# Patient Record
Sex: Male | Born: 1950 | Race: White | Hispanic: No | Marital: Married | Smoking: Never smoker
Health system: Southern US, Community
[De-identification: ages and names within clinical notes are randomized; demographics above are authoritative.]

## PROBLEM LIST (undated history)

## (undated) DIAGNOSIS — M1990 Unspecified osteoarthritis, unspecified site: Secondary | ICD-10-CM

## (undated) DIAGNOSIS — F419 Anxiety disorder, unspecified: Secondary | ICD-10-CM

---

## 2003-01-03 ENCOUNTER — Encounter: Payer: Self-pay | Admitting: Family Medicine

## 2003-01-03 ENCOUNTER — Ambulatory Visit (HOSPITAL_COMMUNITY): Admission: RE | Admit: 2003-01-03 | Discharge: 2003-01-03 | Payer: Self-pay | Admitting: Family Medicine

## 2008-07-21 ENCOUNTER — Ambulatory Visit (HOSPITAL_COMMUNITY): Admission: RE | Admit: 2008-07-21 | Discharge: 2008-07-21 | Payer: Self-pay | Admitting: Family Medicine

## 2011-11-04 ENCOUNTER — Other Ambulatory Visit (HOSPITAL_COMMUNITY): Payer: Self-pay | Admitting: Family Medicine

## 2011-11-04 ENCOUNTER — Ambulatory Visit (HOSPITAL_COMMUNITY)
Admission: RE | Admit: 2011-11-04 | Discharge: 2011-11-04 | Disposition: A | Discharge status: Home / Self Care | Payer: BC Managed Care – PPO | Source: Ambulatory Visit | Attending: Family Medicine | Admitting: Family Medicine

## 2011-11-04 DIAGNOSIS — R52 Pain, unspecified: Secondary | ICD-10-CM

## 2011-11-04 DIAGNOSIS — M7989 Other specified soft tissue disorders: Secondary | ICD-10-CM | POA: Insufficient documentation

## 2011-11-04 DIAGNOSIS — M79609 Pain in unspecified limb: Secondary | ICD-10-CM | POA: Insufficient documentation

## 2011-12-12 HISTORY — PX: CARPOMETACARPAL (CMC) FUSION OF THUMB: SHX6290

## 2012-08-20 ENCOUNTER — Encounter (HOSPITAL_BASED_OUTPATIENT_CLINIC_OR_DEPARTMENT_OTHER): Payer: Self-pay | Admitting: *Deleted

## 2012-08-22 ENCOUNTER — Encounter (HOSPITAL_BASED_OUTPATIENT_CLINIC_OR_DEPARTMENT_OTHER): Payer: Self-pay | Admitting: Anesthesiology

## 2012-08-22 ENCOUNTER — Ambulatory Visit (HOSPITAL_BASED_OUTPATIENT_CLINIC_OR_DEPARTMENT_OTHER)
Admission: RE | Admit: 2012-08-22 | Discharge: 2012-08-22 | Disposition: A | Discharge status: Home / Self Care | Payer: BC Managed Care – PPO | Source: Ambulatory Visit | Attending: Orthopedic Surgery | Admitting: Orthopedic Surgery

## 2012-08-22 ENCOUNTER — Encounter (HOSPITAL_BASED_OUTPATIENT_CLINIC_OR_DEPARTMENT_OTHER)
Admission: RE | Disposition: A | Discharge status: Home / Self Care | Payer: Self-pay | Source: Ambulatory Visit | Attending: Orthopedic Surgery

## 2012-08-22 ENCOUNTER — Encounter (HOSPITAL_BASED_OUTPATIENT_CLINIC_OR_DEPARTMENT_OTHER): Payer: Self-pay | Admitting: *Deleted

## 2012-08-22 ENCOUNTER — Ambulatory Visit (HOSPITAL_BASED_OUTPATIENT_CLINIC_OR_DEPARTMENT_OTHER): Payer: BC Managed Care – PPO | Admitting: Anesthesiology

## 2012-08-22 DIAGNOSIS — M5412 Radiculopathy, cervical region: Secondary | ICD-10-CM | POA: Insufficient documentation

## 2012-08-22 DIAGNOSIS — M25549 Pain in joints of unspecified hand: Secondary | ICD-10-CM | POA: Insufficient documentation

## 2012-08-22 DIAGNOSIS — M19049 Primary osteoarthritis, unspecified hand: Secondary | ICD-10-CM

## 2012-08-22 HISTORY — DX: Unspecified osteoarthritis, unspecified site: M19.90

## 2012-08-22 HISTORY — PX: CARPOMETACARPEL SUSPENSION PLASTY: SHX5005

## 2012-08-22 SURGERY — CARPOMETACARPEL (CMC) SUSPENSION PLASTY
Anesthesia: General | Site: Hand | Laterality: Right | Wound class: Clean

## 2012-08-22 MED ORDER — FENTANYL CITRATE 0.05 MG/ML IJ SOLN
INTRAMUSCULAR | Status: DC | PRN
  Administered 2012-08-22 (×3): 25 ug via INTRAVENOUS

## 2012-08-22 MED ORDER — PROMETHAZINE HCL 25 MG/ML IJ SOLN
6.2500 mg | INTRAMUSCULAR | Status: DC | PRN
  Administered 2012-08-22: 6.25 mg via INTRAVENOUS

## 2012-08-22 MED ORDER — HYDROMORPHONE HCL PF 1 MG/ML IJ SOLN
0.2500 mg | INTRAMUSCULAR | Status: DC | PRN
  Administered 2012-08-22 (×4): 0.5 mg via INTRAVENOUS

## 2012-08-22 MED ORDER — CEFAZOLIN SODIUM 1-5 GM-% IV SOLN: 1.0000 g | Freq: Once | INTRAVENOUS | Status: DC

## 2012-08-22 MED ORDER — LACTATED RINGERS IV SOLN
INTRAVENOUS | Status: DC
  Administered 2012-08-22: 10:00:00 via INTRAVENOUS
  Administered 2012-08-22: 20 mL/h via INTRAVENOUS
  Administered 2012-08-22: 11:00:00 via INTRAVENOUS

## 2012-08-22 MED ORDER — DEXAMETHASONE SODIUM PHOSPHATE 4 MG/ML IJ SOLN
INTRAMUSCULAR | Status: DC | PRN
  Administered 2012-08-22: 8 mg via INTRAVENOUS

## 2012-08-22 MED ORDER — DEXAMETHASONE SODIUM PHOSPHATE 4 MG/ML IJ SOLN
INTRAMUSCULAR | Status: DC | PRN
  Administered 2012-08-22: 10 mg

## 2012-08-22 MED ORDER — MIDAZOLAM HCL 2 MG/2ML IJ SOLN
1.0000 mg | INTRAMUSCULAR | Status: DC | PRN
  Administered 2012-08-22: 2 mg via INTRAVENOUS

## 2012-08-22 MED ORDER — OXYCODONE-ACETAMINOPHEN 5-325 MG PO TABS: 1.0000 | ORAL_TABLET | ORAL | Status: AC | PRN

## 2012-08-22 MED ORDER — OXYCODONE HCL 5 MG/5ML PO SOLN: 5.0000 mg | Freq: Once | ORAL | Status: DC | PRN

## 2012-08-22 MED ORDER — CEFAZOLIN SODIUM 1-5 GM-% IV SOLN
1.0000 g | Freq: Once | INTRAVENOUS | Status: AC
  Administered 2012-08-22: 2 g via INTRAVENOUS

## 2012-08-22 MED ORDER — PROPOFOL 10 MG/ML IV BOLUS
INTRAVENOUS | Status: DC | PRN
  Administered 2012-08-22: 200 mg via INTRAVENOUS

## 2012-08-22 MED ORDER — MIDAZOLAM HCL 5 MG/5ML IJ SOLN
INTRAMUSCULAR | Status: DC | PRN
  Administered 2012-08-22: 1 mg via INTRAVENOUS

## 2012-08-22 MED ORDER — BUPIVACAINE-EPINEPHRINE PF 0.5-1:200000 % IJ SOLN
INTRAMUSCULAR | Status: DC | PRN
  Administered 2012-08-22: 150 mg

## 2012-08-22 MED ORDER — OXYCODONE HCL 5 MG PO TABS: 5.0000 mg | ORAL_TABLET | Freq: Once | ORAL | Status: DC | PRN

## 2012-08-22 MED ORDER — ONDANSETRON HCL 4 MG/2ML IJ SOLN
INTRAMUSCULAR | Status: DC | PRN
  Administered 2012-08-22: 4 mg via INTRAVENOUS

## 2012-08-22 MED ORDER — FENTANYL CITRATE 0.05 MG/ML IJ SOLN
50.0000 ug | INTRAMUSCULAR | Status: DC | PRN
  Administered 2012-08-22: 100 ug via INTRAVENOUS

## 2012-08-22 SURGICAL SUPPLY — 56 items
APL SKNCLS STERI-STRIP NONHPOA (GAUZE/BANDAGES/DRESSINGS) ×1
BAG DECANTER FOR FLEXI CONT (MISCELLANEOUS) IMPLANT
BANDAGE ELASTIC 4 VELCRO ST LF (GAUZE/BANDAGES/DRESSINGS) ×2 IMPLANT
BANDAGE GAUZE ELAST BULKY 4 IN (GAUZE/BANDAGES/DRESSINGS) ×2 IMPLANT
BENZOIN TINCTURE PRP APPL 2/3 (GAUZE/BANDAGES/DRESSINGS) ×2 IMPLANT
BLADE MINI RND TIP GREEN BEAV (BLADE) ×2 IMPLANT
BLADE SURG 15 STRL LF DISP TIS (BLADE) ×1 IMPLANT
BLADE SURG 15 STRL SS (BLADE) ×2
BNDG CMPR 9X4 STRL LF SNTH (GAUZE/BANDAGES/DRESSINGS) ×1
BNDG CMPR MD 5X2 ELC HKLP STRL (GAUZE/BANDAGES/DRESSINGS)
BNDG COHESIVE 4X5 TAN STRL (GAUZE/BANDAGES/DRESSINGS) ×2 IMPLANT
BNDG ELASTIC 2 VLCR STRL LF (GAUZE/BANDAGES/DRESSINGS) IMPLANT
BNDG ESMARK 4X9 LF (GAUZE/BANDAGES/DRESSINGS) ×2 IMPLANT
CLOTH BEACON ORANGE TIMEOUT ST (SAFETY) ×2 IMPLANT
CORDS BIPOLAR (ELECTRODE) ×2 IMPLANT
COVER TABLE BACK 60X90 (DRAPES) ×2 IMPLANT
CUFF TOURNIQUET SINGLE 18IN (TOURNIQUET CUFF) ×2 IMPLANT
DECANTER SPIKE VIAL GLASS SM (MISCELLANEOUS) IMPLANT
DRAPE EXTREMITY T 121X128X90 (DRAPE) ×2 IMPLANT
DRAPE OEC MINIVIEW 54X84 (DRAPES) ×2 IMPLANT
DRAPE SURG 17X23 STRL (DRAPES) ×2 IMPLANT
DURAPREP 26ML APPLICATOR (WOUND CARE) ×2 IMPLANT
GAUZE SPONGE 4X4 16PLY XRAY LF (GAUZE/BANDAGES/DRESSINGS) IMPLANT
GAUZE XEROFORM 1X8 LF (GAUZE/BANDAGES/DRESSINGS) ×2 IMPLANT
GLOVE BIO SURGEON STRL SZ 6.5 (GLOVE) ×2 IMPLANT
GLOVE BIO SURGEON STRL SZ8.5 (GLOVE) ×4 IMPLANT
GLOVE INDICATOR 7.0 STRL GRN (GLOVE) ×2 IMPLANT
GOWN PREVENTION PLUS XLARGE (GOWN DISPOSABLE) ×2 IMPLANT
NEEDLE HYPO 25X1 1.5 SAFETY (NEEDLE) IMPLANT
NS IRRIG 1000ML POUR BTL (IV SOLUTION) ×2 IMPLANT
PACK BASIN DAY SURGERY FS (CUSTOM PROCEDURE TRAY) ×2 IMPLANT
PAD CAST 3X4 CTTN HI CHSV (CAST SUPPLIES) ×1 IMPLANT
PADDING CAST ABS 3INX4YD NS (CAST SUPPLIES)
PADDING CAST ABS 4INX4YD NS (CAST SUPPLIES) ×1
PADDING CAST ABS COTTON 3X4 (CAST SUPPLIES) IMPLANT
PADDING CAST ABS COTTON 4X4 ST (CAST SUPPLIES) ×1 IMPLANT
PADDING CAST COTTON 3X4 STRL (CAST SUPPLIES) ×2
PADDING UNDERCAST 2  STERILE (CAST SUPPLIES) IMPLANT
PASSER SUT SWANSON 36MM LOOP (INSTRUMENTS) ×2 IMPLANT
SHEET MEDIUM DRAPE 40X70 STRL (DRAPES) ×2 IMPLANT
SPLINT PLASTER CAST XFAST 4X15 (CAST SUPPLIES) ×5 IMPLANT
SPLINT PLASTER XTRA FAST SET 4 (CAST SUPPLIES) ×5
SPONGE GAUZE 4X4 12PLY (GAUZE/BANDAGES/DRESSINGS) ×2 IMPLANT
STOCKINETTE 4X48 STRL (DRAPES) ×2 IMPLANT
STRIP CLOSURE SKIN 1/2X4 (GAUZE/BANDAGES/DRESSINGS) ×2 IMPLANT
SUT ETHILON 3 0 PS 1 (SUTURE) IMPLANT
SUT FIBERWIRE 2-0 18 17.9 3/8 (SUTURE)
SUT PROLENE 3 0 PS 2 (SUTURE) IMPLANT
SUT VICRYL 4-0 PS2 18IN ABS (SUTURE) IMPLANT
SUT VICRYL RAPIDE 4/0 PS 2 (SUTURE) IMPLANT
SUTURE FIBERWR 2-0 18 17.9 3/8 (SUTURE) IMPLANT
SYR BULB 3OZ (MISCELLANEOUS) ×2 IMPLANT
SYRINGE 10CC LL (SYRINGE) ×2 IMPLANT
TOWEL OR 17X24 6PK STRL BLUE (TOWEL DISPOSABLE) ×2 IMPLANT
UNDERPAD 30X30 INCONTINENT (UNDERPADS AND DIAPERS) ×2 IMPLANT
WATER STERILE IRR 1000ML POUR (IV SOLUTION) ×2 IMPLANT

## 2012-08-22 NOTE — H&P (Signed)
Micheal Moses is an 61 y.o. male.   Chief Complaint: right thumb pain HPI: as above s/p right thumb reconstruction this past spring with persistant pain and irritation of SRN  Past Medical History  Diagnosis Date  . Arthritis     rt hand    Past Surgical History  Procedure Date  . Carpometacarpal (cmc) fusion of thumb 12-2011    rt thumb    History reviewed. No pertinent family history. Social History:  reports that he has never smoked. He has never used smokeless tobacco. He reports that he drinks alcohol. He reports that he does not use illicit drugs.  Allergies: No Known Allergies  Medications Prior to Admission  Medication Sig Dispense Refill  . gabapentin (NEURONTIN) 300 MG capsule Take 300 mg by mouth 3 (three) times daily.      Marland Kitchen oxyCODONE-acetaminophen (PERCOCET) 10-325 MG per tablet Take 1 tablet by mouth every 4 (four) hours as needed.        No results found for this or any previous visit (from the past 48 hour(s)). No results found.  Review of Systems  All other systems reviewed and are negative.    Height 5\' 8"  (1.727 m), weight 85.276 kg (188 lb). Physical Exam  Constitutional: He is oriented to person, place, and time. He appears well-developed and well-nourished.  HENT:  Head: Normocephalic and atraumatic.  Cardiovascular: Normal rate.   Respiratory: Effort normal.  Musculoskeletal:       Right hand: He exhibits tenderness and bony tenderness.       Hands: Neurological: He is alert and oriented to person, place, and time.  Skin: Skin is warm.  Psychiatric: He has a normal mood and affect. His behavior is normal. Judgment and thought content normal.     Assessment/Plan As above  Plan redo right thumb suspensionplasty with SRN neurolysis vs resection  Vernesha Talbot A 08/22/2012, 9:34 AM

## 2012-08-22 NOTE — Transfer of Care (Signed)
Immediate Anesthesia Transfer of Care Note  Patient: Micheal Moses  Procedure(s) Performed: Procedure(s) (LRB) with comments: CARPOMETACARPEL (CMC) SUSPENSION PLASTY (Right) - REDO RIGHT THUMB CMC ARTHROPLASTY with APL Transfer/Osteophyte Removal/Right Wrist Superfacial Radial Nerve Neuroplasty   Patient Location: PACU  Anesthesia Type:GA combined with regional for post-op pain  Level of Consciousness: sedated and patient cooperative  Airway & Oxygen Therapy: Patient Spontanous Breathing and Patient connected to face mask oxygen  Post-op Assessment: Report given to PACU RN and Post -op Vital signs reviewed and stable  Post vital signs: Reviewed and stable  Complications: No apparent anesthesia complications

## 2012-08-22 NOTE — Anesthesia Postprocedure Evaluation (Signed)
Anesthesia Post Note  Patient: Micheal Moses  Procedure(s) Performed: Procedure(s) (LRB): CARPOMETACARPEL (CMC) SUSPENSION PLASTY (Right)  Anesthesia type: general  Patient location: PACU  Post pain: Pain level controlled  Post assessment: Patient's Cardiovascular Status Stable  Last Vitals:  Filed Vitals:   08/22/12 1300  BP: 138/90  Pulse: 76  Temp:   Resp: 15    Post vital signs: Reviewed and stable  Level of consciousness: sedated  Complications: No apparent anesthesia complications

## 2012-08-22 NOTE — Anesthesia Procedure Notes (Addendum)
Anesthesia Regional Block:  Supraclavicular block  Pre-Anesthetic Checklist: ,, timeout performed, Correct Patient, Correct Site, Correct Laterality, Correct Procedure,, site marked, risks and benefits discussed, Surgical consent,  Pre-op evaluation,  At surgeon's request and post-op pain management  Laterality: Right  Prep: chloraprep       Needles:  Injection technique: Single-shot  Needle Type: Echogenic Stimulator Needle     Needle Length: 5cm 5 cm Needle Gauge: 22 and 22 G    Additional Needles:  Procedures: ultrasound guided (picture in chart) and nerve stimulator Supraclavicular block  Nerve Stimulator or Paresthesia:  Response: bicep contraction, 0.45 mA,   Additional Responses:   Narrative:  Start time: 08/22/2012 9:52 AM End time: 08/22/2012 10:06 AM Injection made incrementally with aspirations every 5 mL.  Performed by: Personally  Anesthesiologist: J. Adonis Huguenin, MD  Additional Notes: Functioning IV was confirmed and monitors applied.  A 50mm 22ga echogenic arrow stimulator was used. Sterile prep and drape,hand hygiene and sterile gloves were used.Ultrasound guidance: relevant anatomy identified, needle position confirmed, local anesthetic spread visualized around nerve(s)., vascular puncture avoided.  Image printed for medical record.  Negative aspiration and negative test dose prior to incremental administration of local anesthetic. The patient tolerated the procedure well.  Interscalene brachial plexus block Procedure Name: LMA Insertion Date/Time: 08/22/2012 10:21 AM Performed by: Verlan Friends Pre-anesthesia Checklist: Patient identified, Emergency Drugs available, Suction available, Patient being monitored and Timeout performed Patient Re-evaluated:Patient Re-evaluated prior to inductionOxygen Delivery Method: Circle System Utilized Preoxygenation: Pre-oxygenation with 100% oxygen Intubation Type: IV induction Ventilation: Mask ventilation  without difficulty LMA: LMA inserted LMA Size: 5.0 Number of attempts: 1 Airway Equipment and Method: bite block (left lside) Placement Confirmation: positive ETCO2 Tube secured with: Tape Dental Injury: Teeth and Oropharynx as per pre-operative assessment  Comments: Patient has round-house crowns maxillary and mandibular.  They all appear stable.  Dentition as pre op after insertion of LMA

## 2012-08-22 NOTE — Progress Notes (Signed)
Assisted Dr. Singer with right, ultrasound guided, supraclavicular block. Side rails up, monitors on throughout procedure. See vital signs in flow sheet. Tolerated Procedure well. 

## 2012-08-22 NOTE — Brief Op Note (Signed)
08/22/2012  12:11 PM  PATIENT:  Mosetta Pigeon  61 y.o. 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  POST-OPERATIVE DIAGNOSIS:   RIGHT THUMB AND WRIST ARHTRITIS  PROCEDURE:  Procedure(s) (LRB) with comments: CARPOMETACARPEL (CMC) SUSPENSION PLASTY (Right) - REDO RIGHT THUMB CMC ARTHROPLASTY with APL Transfer/Osteophyte Removal/Right Wrist Superfacial Radial Nerve Neuroplasty   SURGEON:  Surgeon(s) and Role:    * Marlowe Shores, MD - Primary  PHYSICIAN ASSISTANT:   ASSISTANTS: none   ANESTHESIA:   general  EBL:  Total I/O In: 1400 [I.V.:1400] Out: -   BLOOD ADMINISTERED:none  DRAINS: none   LOCAL MEDICATIONS USED:  NONE  SPECIMEN:  No Specimen  DISPOSITION OF SPECIMEN:  N/A  COUNTS:  YES  TOURNIQUET:   Total Tourniquet Time Documented: Upper Arm (Right) - 94 minutes  DICTATION: .Other Dictation: Dictation Number [id number]  PLAN OF CARE: Discharge to home after PACU  PATIENT DISPOSITION:  PACU - hemodynamically stable.   Delay start of Pharmacological VTE agent (>24hrs) due to surgical blood loss or risk of bleeding: not applicable

## 2012-08-22 NOTE — Anesthesia Preprocedure Evaluation (Signed)
Anesthesia Evaluation    Airway       Dental   Pulmonary neg pulmonary ROS,          Cardiovascular negative cardio ROS      Neuro/Psych negative neurological ROS  negative psych ROS   GI/Hepatic negative GI ROS, Neg liver ROS,   Endo/Other  negative endocrine ROS  Renal/GU negative Renal ROS     Musculoskeletal   Abdominal   Peds  Hematology   Anesthesia Other Findings   Reproductive/Obstetrics                           Anesthesia Physical Anesthesia Plan  ASA: II  Anesthesia Plan: General   Post-op Pain Management:    Induction: Intravenous  Airway Management Planned: LMA  Additional Equipment:   Intra-op Plan:   Post-operative Plan: Extubation in OR  Informed Consent:   Plan Discussed with:   Anesthesia Plan Comments:         Anesthesia Quick Evaluation

## 2012-08-22 NOTE — Op Note (Signed)
See note [id number]

## 2012-08-23 ENCOUNTER — Encounter (HOSPITAL_BASED_OUTPATIENT_CLINIC_OR_DEPARTMENT_OTHER): Payer: Self-pay | Admitting: Orthopedic Surgery

## 2012-08-23 LAB — POCT HEMOGLOBIN-HEMACUE: Hemoglobin: 16.9 g/dL (ref 13.0–17.0)

## 2012-08-24 NOTE — Op Note (Signed)
NAME:  Micheal Moses, Micheal Moses                  ACCOUNT NO.:  [id number]  MEDICAL RECORD NO.:  [id number]  LOCATION:                                 FACILITY:  PHYSICIAN:  Artist Pais. Gerrica Cygan, M.D.DATE OF BIRTH:  [birth year]  DATE OF PROCEDURE:  08/22/2012 DATE OF DISCHARGE:                              OPERATIVE REPORT   PREOPERATIVE DIAGNOSES: 1. Right thumb carpometacarpal pain status post flexor carpi radialis     interpositional arthroplasty. 2. Superficial radial nerve neuritis.  POSTOPERATIVE DIAGNOSES: 1. Right thumb carpometacarpal pain status post flexor carpi radialis     interpositional arthroplasty. 2. Superficial radial nerve neuritis.  PROCEDURES: 1. Revision carpometacarpal arthroplasty with abductor pollicis longus     tendon transfer. 2. Neurolysis of superficial radial nerve.  SURGEON:  Artist Pais. Mina Marble, MD  ASSISTANT:  None.  ANESTHESIA:  Axillary block and general.  COMPLICATIONS:  None.  DRAINS:  None.  DESCRIPTION OF PROCEDURE:  The patient was taken to the operating suite. After induction of adequate general anesthesia and axillary block analgesia, the right upper extremity was prepped and draped in sterile fashion.  An Esmarch was used to exsanguinate the limb.  Tourniquet was inflated to 250 mmHg.  At this point in time, incision was made, incorporating the old incision over the [facility] joint, extended proximally up to the area of the radial styloid.  Skin was incised sharply. Dissection was carried down to the carpometacarpal joint.  The carpometacarpal joint was identified fluoroscopically.  The snuffbox artery was carefully identified and retracted.  An arthrotomy was performed.  The previously used FCR interpositional tendon was removed, revealing degenerative changes at the thumb metacarpal base, and the old drill hole which had eroded into a U-shaped defect, the interval between the thumb metacarpal base and index metacarpal base was  carefully debrided of a large osteophyte which was removed in its entirety.  At this point in time, a bone hole was used to create a new transosseous canal in the thumb metacarpal base, exiting in the joint line medially and distally and dorsally 2 cm from the U-shaped defect.  This was done with hand awl and sequential hand drills.  After this was established, the first dorsal compartment was identified and released, lysing all adhesions.  The superficial radial nerve branches were also identified and under loupe magnification, enterolysis was performed.  There was no obvious neuroma formation or injury to the nerve, but some scarring around the original incision.  After this was completed, the radial most slip of the abductor pollicis longus tendon group was transected at the musculotendinous junction, drawn into the distal aspect of the wound. At this point in time, a second incision was made over the index metacarpal base dorsally.  After this was completed, dissection was carried down to the metaphyseal and diaphyseal flare, and a transosseous canal was made in the index metacarpal base, exiting in the original wound.  The wound was then thoroughly irrigated.  All bony debris was removed.  At this point in time, the APL tendon slip was passed through the thumb metacarpal base, and then transferred up dorsally to the index metacarpal and tied on the appropriate  tension to the extensor carpi radialis brevis tendon using 2-0 FiberWire suture in a Pulvertaft weave. The wounds were thoroughly irrigated.  X-ray showed good suspension on AP, lateral, and oblique view, and incisions were closed with 3-0 Prolene subcuticular stitch.  Steri-Strips, 4x4s, fluffs, and a compressive dressing, radial gutter splint was applied.  The patient tolerated the procedure well and went to the recovery room in stable fashion.     Artist Pais Mina Marble, M.D.     MAW/MEDQ  D:  08/22/2012  T:   08/23/2012  Job:  [id number]

## 2012-08-31 ENCOUNTER — Other Ambulatory Visit (HOSPITAL_COMMUNITY)
Admission: RE | Admit: 2012-08-31 | Discharge: 2012-08-31 | Disposition: A | Discharge status: Home / Self Care | Payer: BC Managed Care – PPO | Source: Ambulatory Visit | Attending: Family Medicine | Admitting: Family Medicine

## 2012-08-31 DIAGNOSIS — L989 Disorder of the skin and subcutaneous tissue, unspecified: Secondary | ICD-10-CM | POA: Insufficient documentation

## 2012-12-05 ENCOUNTER — Other Ambulatory Visit: Payer: Self-pay | Admitting: Physician Assistant

## 2014-01-17 ENCOUNTER — Other Ambulatory Visit: Payer: Self-pay | Admitting: Physician Assistant

## 2015-12-01 DIAGNOSIS — M25561 Pain in right knee: Secondary | ICD-10-CM | POA: Diagnosis not present

## 2016-01-19 DIAGNOSIS — M79641 Pain in right hand: Secondary | ICD-10-CM | POA: Diagnosis not present

## 2016-01-19 DIAGNOSIS — Z79891 Long term (current) use of opiate analgesic: Secondary | ICD-10-CM | POA: Diagnosis not present

## 2016-01-19 DIAGNOSIS — M25561 Pain in right knee: Secondary | ICD-10-CM | POA: Diagnosis not present

## 2016-01-22 DIAGNOSIS — E663 Overweight: Secondary | ICD-10-CM | POA: Diagnosis not present

## 2016-01-22 DIAGNOSIS — C44509 Unspecified malignant neoplasm of skin of other part of trunk: Secondary | ICD-10-CM | POA: Diagnosis not present

## 2016-01-22 DIAGNOSIS — Z87442 Personal history of urinary calculi: Secondary | ICD-10-CM | POA: Diagnosis not present

## 2016-01-22 DIAGNOSIS — Z79891 Long term (current) use of opiate analgesic: Secondary | ICD-10-CM | POA: Diagnosis not present

## 2016-05-30 DIAGNOSIS — Z79891 Long term (current) use of opiate analgesic: Secondary | ICD-10-CM | POA: Diagnosis not present

## 2016-05-30 DIAGNOSIS — Z23 Encounter for immunization: Secondary | ICD-10-CM | POA: Diagnosis not present

## 2016-05-30 DIAGNOSIS — M25561 Pain in right knee: Secondary | ICD-10-CM | POA: Diagnosis not present

## 2016-05-30 DIAGNOSIS — M79641 Pain in right hand: Secondary | ICD-10-CM | POA: Diagnosis not present

## 2016-07-05 DIAGNOSIS — Z79891 Long term (current) use of opiate analgesic: Secondary | ICD-10-CM | POA: Diagnosis not present

## 2016-07-05 DIAGNOSIS — F419 Anxiety disorder, unspecified: Secondary | ICD-10-CM | POA: Diagnosis not present

## 2016-07-05 DIAGNOSIS — F4321 Adjustment disorder with depressed mood: Secondary | ICD-10-CM | POA: Diagnosis not present

## 2016-07-05 DIAGNOSIS — M79641 Pain in right hand: Secondary | ICD-10-CM | POA: Diagnosis not present

## 2016-07-26 DIAGNOSIS — L570 Actinic keratosis: Secondary | ICD-10-CM | POA: Diagnosis not present

## 2016-07-26 DIAGNOSIS — L298 Other pruritus: Secondary | ICD-10-CM | POA: Diagnosis not present

## 2016-08-09 DIAGNOSIS — M79641 Pain in right hand: Secondary | ICD-10-CM | POA: Diagnosis not present

## 2016-08-09 DIAGNOSIS — M25561 Pain in right knee: Secondary | ICD-10-CM | POA: Diagnosis not present

## 2016-08-09 DIAGNOSIS — F4321 Adjustment disorder with depressed mood: Secondary | ICD-10-CM | POA: Diagnosis not present

## 2016-08-09 DIAGNOSIS — Z79891 Long term (current) use of opiate analgesic: Secondary | ICD-10-CM | POA: Diagnosis not present

## 2016-11-07 DIAGNOSIS — G4700 Insomnia, unspecified: Secondary | ICD-10-CM | POA: Diagnosis not present

## 2016-11-07 DIAGNOSIS — N529 Male erectile dysfunction, unspecified: Secondary | ICD-10-CM | POA: Diagnosis not present

## 2016-11-07 DIAGNOSIS — M79641 Pain in right hand: Secondary | ICD-10-CM | POA: Diagnosis not present

## 2016-11-07 DIAGNOSIS — Z79891 Long term (current) use of opiate analgesic: Secondary | ICD-10-CM | POA: Diagnosis not present

## 2016-12-15 ENCOUNTER — Other Ambulatory Visit: Payer: Self-pay | Admitting: Physician Assistant

## 2016-12-15 DIAGNOSIS — L570 Actinic keratosis: Secondary | ICD-10-CM | POA: Diagnosis not present

## 2016-12-15 DIAGNOSIS — D492 Neoplasm of unspecified behavior of bone, soft tissue, and skin: Secondary | ICD-10-CM | POA: Diagnosis not present

## 2017-02-08 DIAGNOSIS — M79641 Pain in right hand: Secondary | ICD-10-CM | POA: Diagnosis not present

## 2017-02-08 DIAGNOSIS — G4700 Insomnia, unspecified: Secondary | ICD-10-CM | POA: Diagnosis not present

## 2017-02-08 DIAGNOSIS — Z79891 Long term (current) use of opiate analgesic: Secondary | ICD-10-CM | POA: Diagnosis not present

## 2017-02-08 DIAGNOSIS — M79671 Pain in right foot: Secondary | ICD-10-CM | POA: Diagnosis not present

## 2017-05-09 DIAGNOSIS — M25561 Pain in right knee: Secondary | ICD-10-CM | POA: Diagnosis not present

## 2017-05-09 DIAGNOSIS — G4700 Insomnia, unspecified: Secondary | ICD-10-CM | POA: Diagnosis not present

## 2017-05-09 DIAGNOSIS — M79671 Pain in right foot: Secondary | ICD-10-CM | POA: Diagnosis not present

## 2017-05-09 DIAGNOSIS — Z79891 Long term (current) use of opiate analgesic: Secondary | ICD-10-CM | POA: Diagnosis not present

## 2017-05-17 ENCOUNTER — Ambulatory Visit (HOSPITAL_COMMUNITY)
Admission: RE | Admit: 2017-05-17 | Discharge: 2017-05-17 | Disposition: A | Discharge status: Home / Self Care | Payer: PPO | Source: Ambulatory Visit | Attending: Family Medicine | Admitting: Family Medicine

## 2017-05-17 ENCOUNTER — Other Ambulatory Visit (HOSPITAL_COMMUNITY): Payer: Self-pay | Admitting: Family Medicine

## 2017-05-17 DIAGNOSIS — R52 Pain, unspecified: Secondary | ICD-10-CM

## 2017-05-17 DIAGNOSIS — M79671 Pain in right foot: Secondary | ICD-10-CM | POA: Insufficient documentation

## 2017-08-09 DIAGNOSIS — M25561 Pain in right knee: Secondary | ICD-10-CM | POA: Diagnosis not present

## 2017-08-09 DIAGNOSIS — Z23 Encounter for immunization: Secondary | ICD-10-CM | POA: Diagnosis not present

## 2017-08-09 DIAGNOSIS — Z79891 Long term (current) use of opiate analgesic: Secondary | ICD-10-CM | POA: Diagnosis not present

## 2017-08-09 DIAGNOSIS — M79641 Pain in right hand: Secondary | ICD-10-CM | POA: Diagnosis not present

## 2017-08-09 DIAGNOSIS — G4700 Insomnia, unspecified: Secondary | ICD-10-CM | POA: Diagnosis not present

## 2017-08-18 DIAGNOSIS — M25561 Pain in right knee: Secondary | ICD-10-CM | POA: Diagnosis not present

## 2017-08-18 DIAGNOSIS — M79641 Pain in right hand: Secondary | ICD-10-CM | POA: Diagnosis not present

## 2017-08-18 DIAGNOSIS — E663 Overweight: Secondary | ICD-10-CM | POA: Diagnosis not present

## 2017-08-18 DIAGNOSIS — G4700 Insomnia, unspecified: Secondary | ICD-10-CM | POA: Diagnosis not present

## 2017-09-14 ENCOUNTER — Telehealth: Payer: Self-pay

## 2017-09-14 NOTE — Telephone Encounter (Signed)
Pt is aware we do not know how much it would cost for a polyp to be removed. He said he was new at this and he was just asking questions.  See separate triage.

## 2017-09-14 NOTE — Telephone Encounter (Signed)
Pt called asking to speak with DS. I told him that I would have to take a message and she could call him back. He wants to know if a polyp is removed during his colonoscopy how much would it cost to cut it out. Please call him at [phone] He said to call him later this afternoon.

## 2017-09-14 NOTE — Telephone Encounter (Signed)
Gastroenterology Pre-Procedure Review  Request Date: 09/14/2017 Requesting Physician:Dr. Karie Kirks   PATIENT REVIEW QUESTIONS: The patient responded to the following health history questions as indicated:    1. Diabetes Melitis: no 2. Joint replacements in the past 12 months: no 3. Major health problems in the past 3 months: NO 4. Has an artificial valve or MVP: no 5. Has a defibrillator: no 6. Has been advised in past to take antibiotics in advance of a procedure like teeth cleaning: no 7. Family history of colon cancer: no  8. Alcohol Use: YES    One 6 pack of beer on week-ends  ( in the summer he drinks one beer a day)  9. History of sleep apnea: no  10. History of coronary artery or other vascular stents placed within the last 12 months: no 11. History of any prior anesthesia complications: no    MEDICATIONS & ALLERGIES:    Patient reports the following regarding taking any blood thinners:   Plavix? no Aspirin? no Coumadin? no Brilinta? no Xarelto? no Eliquis? no Pradaxa? no Savaysa? no Effient? no  Patient confirms/reports the following medications:  Current Outpatient Medications  Medication Sig Dispense Refill  . oxyCODONE-acetaminophen (ROXICET) 5-325 MG per tablet Take 1 tablet by mouth every 4 (four) hours as needed for pain. (Patient taking differently: Take 1 tablet by mouth every 4 (four) hours as needed for pain. PT said he has a bad knee and bad foot. Sometimes he takes 3 tablets a day and sometimes not any. He has been on this for about 6 months.) 30 tablet 0   No current facility-administered medications for this visit.     Patient confirms/reports the following allergies:  No Known Allergies  No orders of the defined types were placed in this encounter.   AUTHORIZATION INFORMATION Primary Insurance:   ID #:  Group #:  Pre-Cert / Auth required:  Pre-Cert / Auth #:   Secondary Insurance:   ID #:   Group #:  Pre-Cert / Auth required:  Pre-Cert / Auth  #:   SCHEDULE INFORMATION: Procedure has been scheduled as follows:  Date:       Time:  Location:   This Gastroenterology Pre-Precedure Review Form is being routed to the following provider(s): Barney Drain, MD

## 2017-09-16 NOTE — Telephone Encounter (Signed)
Needs ov due to regular etoh use and oxycodone. Will need propofol.

## 2017-09-18 NOTE — Telephone Encounter (Signed)
Pt is scheduled for an Ov with Walden Field, NP on 11/08/2017 at 3:30 pm.

## 2017-09-18 NOTE — Telephone Encounter (Signed)
LMOM to call.

## 2017-11-01 DIAGNOSIS — C44509 Unspecified malignant neoplasm of skin of other part of trunk: Secondary | ICD-10-CM | POA: Diagnosis not present

## 2017-11-01 DIAGNOSIS — M79641 Pain in right hand: Secondary | ICD-10-CM | POA: Diagnosis not present

## 2017-11-01 DIAGNOSIS — Z79891 Long term (current) use of opiate analgesic: Secondary | ICD-10-CM | POA: Diagnosis not present

## 2017-11-01 DIAGNOSIS — M25561 Pain in right knee: Secondary | ICD-10-CM | POA: Diagnosis not present

## 2017-11-08 ENCOUNTER — Encounter: Payer: Self-pay | Admitting: Nurse Practitioner

## 2017-11-08 ENCOUNTER — Other Ambulatory Visit: Payer: Self-pay

## 2017-11-08 ENCOUNTER — Ambulatory Visit (INDEPENDENT_AMBULATORY_CARE_PROVIDER_SITE_OTHER): Payer: PPO | Admitting: Nurse Practitioner

## 2017-11-08 DIAGNOSIS — R69 Illness, unspecified: Secondary | ICD-10-CM | POA: Diagnosis not present

## 2017-11-08 DIAGNOSIS — Z1211 Encounter for screening for malignant neoplasm of colon: Secondary | ICD-10-CM

## 2017-11-08 MED ORDER — PEG 3350-KCL-NA BICARB-NACL 420 G PO SOLR: 4000.0000 mL | ORAL | 0 refills | Status: DC

## 2017-11-08 NOTE — Progress Notes (Signed)
Primary Care Physician:  Lemmie Evens, MD Primary Gastroenterologist:  Dr. Oneida Alar  Chief Complaint  Patient presents with  . Colonoscopy    never had tcs    HPI:   Micheal Moses is a 67 y.o. male who presents on referral for scheduling of colonoscopy.  Phone triage was deferred to office visit due to alcohol consumption including a 6 pack of beer on the weekends and during the summer daily beer.  He is also on narcotic pain medications.  No history of colonoscopy in our system.  Today he states he's doing ok overall. States he's had an itch on his right buttock. Scratched it raw, applied neosporin and tried [place]. Denies abdominal pain, N/V, hematochezia, melena, fever, chills, unintentional weight loss, acute changes in bowel movements. Denies chest pain, dyspnea, dizziness, lightheadedness, syncope, near syncope. Denies any other upper or lower GI symptoms.  Past Medical History:  Diagnosis Date  . Arthritis    rt hand    Past Surgical History:  Procedure Laterality Date  . CARPOMETACARPAL (CMC) FUSION OF THUMB  12-2011   rt thumb  . CARPOMETACARPEL SUSPENSION PLASTY  08/22/2012   Procedure: CARPOMETACARPEL ([place]) SUSPENSION PLASTY;  Surgeon: Schuyler Amor, MD;  Location: [facility];  Service: Orthopedics;  Laterality: Right;  REDO RIGHT THUMB CMC ARTHROPLASTY with APL Transfer/Osteophyte Removal/Right Wrist Superfacial Radial Nerve Neuroplasty     Current Outpatient Medications  Medication Sig Dispense Refill  . ALPRAZolam (XANAX) 0.5 MG tablet Take 1 tablet by mouth daily.    Marland Kitchen oxyCODONE-acetaminophen (ROXICET) 5-325 MG per tablet Take 1 tablet by mouth every 4 (four) hours as needed for pain. (Patient taking differently: Take 1 tablet by mouth every 4 (four) hours as needed for pain. PT said he has a bad knee and bad foot. Sometimes he takes 3 tablets a day and sometimes not any. He has been on this for about 6 months.) 30 tablet 0   No current  facility-administered medications for this visit.     Allergies as of 11/08/2017 - Review Complete 11/08/2017  Allergen Reaction Noted  . Other Nausea And Vomiting 11/08/2017    Family History  Problem Relation Age of Onset  . Colon cancer Neg Hx     Social History   Socioeconomic History  . Marital status: Married    Spouse name: Not on file  . Number of children: Not on file  . Years of education: Not on file  . Highest education level: Not on file  Social Needs  . Financial resource strain: Not on file  . Food insecurity - worry: Not on file  . Food insecurity - inability: Not on file  . Transportation needs - medical: Not on file  . Transportation needs - non-medical: Not on file  Occupational History  . Not on file  Tobacco Use  . Smoking status: Never Smoker  . Smokeless tobacco: Never Used  Substance and Sexual Activity  . Alcohol use: Yes    Comment: beer a day  . Drug use: Yes    Types: Marijuana    Comment: occasionally; once every 2-3 months.  . Sexual activity: Not on file  Other Topics Concern  . Not on file  Social History Narrative  . Not on file    Review of Systems: Complete ROS negative except as per HPI.    Physical Exam: BP 130/74   Pulse 65   Temp (!) 96.6 F (35.9 C) (Oral)   Ht  5\' 8"  (1.727 m)   Wt 182 lb 6.4 oz (82.7 kg)   BMI 27.73 kg/m  General:   Alert and oriented. Pleasant and cooperative. Well-nourished and well-developed.  Eyes:  Without icterus, sclera clear and conjunctiva pink.  Ears:  Normal auditory acuity. Cardiovascular:  S1, S2 present without murmurs appreciated. Extremities without clubbing or edema. Respiratory:  Clear to auscultation bilaterally. No wheezes, rales, or rhonchi. No distress.  Gastrointestinal:  +BS, soft, non-tender and non-distended. No HSM noted. No guarding or rebound. No masses appreciated.  Rectal:  Deferred  Musculoskalatal:  Symmetrical without gross deformities. Neurologic:  Alert and  oriented x4;  grossly normal neurologically. Psych:  Alert and cooperative. Normal mood and affect. Heme/Lymph/Immune: No excessive bruising noted.    11/08/2017 4:07 PM   Disclaimer: This note was dictated with voice recognition software. Similar sounding words can inadvertently be transcribed and may not be corrected upon review.

## 2017-11-08 NOTE — Assessment & Plan Note (Addendum)
Patient is due for routine colonoscopy.  This will be his first ever colonoscopy.  He was brought into the office due to alcohol and medications.  He will need augmented sedation.  No other overt increased risk factors identified.  We will proceed with colonoscopy at this time.  The patient notes for his hand surgery under general anesthesia, it took a prolonged time for him to wake up.  Proceed with colonoscopy on propofol/MAC with Dr. Oneida Alar in the near future. The risks, benefits, and alternatives have been discussed in detail with the patient. They state understanding and desire to proceed.   Patient is currently on Xanax and oxycodone.  He drinks about 1 beer a day.  Occasional/rare marijuana use.  No other drugs.  No other anticoagulants, anxiolytics, chronic pain medications, or antidepressants.  We will plan for the procedure on propofol/MAC to promote adequate sedation.

## 2017-11-08 NOTE — Patient Instructions (Signed)
1. We will schedule your procedure for you. 2. Further recommendations will be made after your colonoscopy. 3. Return for follow-up based on the recommendations made after your colonoscopy. 4. Call us if you have any questions or concerns.

## 2017-11-09 ENCOUNTER — Telehealth: Payer: Self-pay

## 2017-11-09 NOTE — Progress Notes (Signed)
cc'ed to pcp °

## 2017-11-09 NOTE — Telephone Encounter (Signed)
Tried to call pt to inform of pre-op appt 01/02/18 at 10:00am, no answer, LMOAM. Letter mailed.

## 2017-11-15 DIAGNOSIS — L570 Actinic keratosis: Secondary | ICD-10-CM | POA: Diagnosis not present

## 2017-11-15 DIAGNOSIS — L821 Other seborrheic keratosis: Secondary | ICD-10-CM | POA: Diagnosis not present

## 2017-11-15 DIAGNOSIS — D229 Melanocytic nevi, unspecified: Secondary | ICD-10-CM | POA: Diagnosis not present

## 2017-11-24 DIAGNOSIS — J029 Acute pharyngitis, unspecified: Secondary | ICD-10-CM | POA: Diagnosis not present

## 2017-12-28 NOTE — Patient Instructions (Signed)
Micheal Moses  12/28/2017     @PREFPERIOPPHARMACY @   Your procedure is scheduled on 01/09/2018.  Report to [name] at 7:30 A.M.  Call this number if you have problems the morning of surgery:  [phone]   Remember:  Do not eat food or drink liquids after midnight.  Take these medicines the morning of surgery with A SIP OF WATER xanax, oxycodone if needed   Do not wear jewelry, make-up or nail polish.  Do not wear lotions, powders, or perfumes, or deodorant.  Do not shave 48 hours prior to surgery.  Men may shave face and neck.  Do not bring valuables to the hospital.  [facility] is not responsible for any belongings or valuables.  Contacts, dentures or bridgework may not be worn into surgery.  Leave your suitcase in the car.  After surgery it may be brought to your room.  For patients admitted to the hospital, discharge time will be determined by your treatment team.  Patients discharged the day of surgery will not be allowed to drive home.    Please read over the following fact sheets that you were given. Anesthesia Post-op Instructions       PATIENT INSTRUCTIONS POST-ANESTHESIA  IMMEDIATELY FOLLOWING SURGERY:  Do not drive or operate machinery for the first twenty four hours after surgery.  Do not make any important decisions for twenty four hours after surgery or while taking narcotic pain medications or sedatives.  If you develop intractable nausea and vomiting or a severe headache please notify your doctor immediately.  FOLLOW-UP:  Please make an appointment with your surgeon as instructed. You do not need to follow up with anesthesia unless specifically instructed to do so.  WOUND CARE INSTRUCTIONS (if applicable):  Keep a dry clean dressing on the anesthesia/puncture wound site if there is drainage.  Once the wound has quit draining you may leave it open to air.  Generally you should leave the bandage intact for twenty four hours unless there is drainage.  If  the epidural site drains for more than 36-48 hours please call the anesthesia department.  QUESTIONS?:  Please feel free to call your physician or the hospital operator if you have any questions, and they will be happy to assist you.      Colonoscopy, Adult A colonoscopy is an exam to look at the entire large intestine. During the exam, a lubricated, bendable tube is inserted into the anus and then passed into the rectum, colon, and other parts of the large intestine. A colonoscopy is often done as a part of normal colorectal screening or in response to certain symptoms, such as anemia, persistent diarrhea, abdominal pain, and blood in the stool. The exam can help screen for and diagnose medical problems, including:  Tumors.  Polyps.  Inflammation.  Areas of bleeding.  Tell a health care provider about:  Any allergies you have.  All medicines you are taking, including vitamins, herbs, eye drops, creams, and over-the-counter medicines.  Any problems you or family members have had with anesthetic medicines.  Any blood disorders you have.  Any surgeries you have had.  Any medical conditions you have.  Any problems you have had passing stool. What are the risks? Generally, this is a safe procedure. However, problems may occur, including:  Bleeding.  A tear in the intestine.  A reaction to medicines given during the exam.  Infection (rare).  What happens before the procedure? Eating and drinking restrictions Follow instructions from your  health care provider about eating and drinking, which may include:  A few days before the procedure - follow a low-fiber diet. Avoid nuts, seeds, dried fruit, raw fruits, and vegetables.  1-3 days before the procedure - follow a clear liquid diet. Drink only clear liquids, such as clear broth or bouillon, black coffee or tea, clear juice, clear soft drinks or sports drinks, gelatin dessert, and popsicles. Avoid any liquids that contain red  or purple dye.  On the day of the procedure - do not eat or drink anything during the 2 hours before the procedure, or within the time period that your health care provider recommends.  Bowel prep If you were prescribed an oral bowel prep to clean out your colon:  Take it as told by your health care provider. Starting the day before your procedure, you will need to drink a large amount of medicated liquid. The liquid will cause you to have multiple loose stools until your stool is almost clear or light green.  If your skin or anus gets irritated from diarrhea, you may use these to relieve the irritation: ? Medicated wipes, such as adult wet wipes with aloe and vitamin E. ? A skin soothing-product like petroleum jelly.  If you vomit while drinking the bowel prep, take a break for up to 60 minutes and then begin the bowel prep again. If vomiting continues and you cannot take the bowel prep without vomiting, call your health care provider.  General instructions  Ask your health care provider about changing or stopping your regular medicines. This is especially important if you are taking diabetes medicines or blood thinners.  Plan to have someone take you home from the hospital or clinic. What happens during the procedure?  An IV tube may be inserted into one of your veins.  You will be given medicine to help you relax (sedative).  To reduce your risk of infection: ? Your health care team will wash or sanitize their hands. ? Your anal area will be washed with soap.  You will be asked to lie on your side with your knees bent.  Your health care provider will lubricate a long, thin, flexible tube. The tube will have a camera and a light on the end.  The tube will be inserted into your anus.  The tube will be gently eased through your rectum and colon.  Air will be delivered into your colon to keep it open. You may feel some pressure or cramping.  The camera will be used to take  images during the procedure.  A small tissue sample may be removed from your body to be examined under a microscope (biopsy). If any potential problems are found, the tissue will be sent to a lab for testing.  If small polyps are found, your health care provider may remove them and have them checked for cancer cells.  The tube that was inserted into your anus will be slowly removed. The procedure may vary among health care providers and hospitals. What happens after the procedure?  Your blood pressure, heart rate, breathing rate, and blood oxygen level will be monitored until the medicines you were given have worn off.  Do not drive for 24 hours after the exam.  You may have a small amount of blood in your stool.  You may pass gas and have mild abdominal cramping or bloating due to the air that was used to inflate your colon during the exam.  It is up to you  to get the results of your procedure. Ask your health care provider, or the department performing the procedure, when your results will be ready. This information is not intended to replace advice given to you by your health care provider. Make sure you discuss any questions you have with your health care provider. Document Released: 08/26/2000 Document Revised: 06/29/2016 Document Reviewed: 11/10/2015 Elsevier Interactive Patient Education  2018 [employer].

## 2018-01-02 ENCOUNTER — Other Ambulatory Visit: Payer: Self-pay

## 2018-01-02 ENCOUNTER — Encounter (HOSPITAL_COMMUNITY)
Admission: RE | Admit: 2018-01-02 | Discharge: 2018-01-02 | Disposition: A | Discharge status: Home / Self Care | Payer: PPO | Source: Ambulatory Visit | Attending: Gastroenterology | Admitting: Gastroenterology

## 2018-01-02 ENCOUNTER — Encounter (HOSPITAL_COMMUNITY): Payer: Self-pay

## 2018-01-02 DIAGNOSIS — Z01818 Encounter for other preprocedural examination: Secondary | ICD-10-CM | POA: Insufficient documentation

## 2018-01-02 DIAGNOSIS — Z01812 Encounter for preprocedural laboratory examination: Secondary | ICD-10-CM | POA: Diagnosis not present

## 2018-01-02 DIAGNOSIS — R001 Bradycardia, unspecified: Secondary | ICD-10-CM | POA: Insufficient documentation

## 2018-01-02 HISTORY — DX: Anxiety disorder, unspecified: F41.9

## 2018-01-02 LAB — BASIC METABOLIC PANEL
Anion gap: 11 (ref 5–15)
BUN: 17 mg/dL (ref 6–20)
CHLORIDE: 103 mmol/L (ref 101–111)
CO2: 23 mmol/L (ref 22–32)
Calcium: 9.4 mg/dL (ref 8.9–10.3)
Creatinine, Ser: 1.14 mg/dL (ref 0.61–1.24)
GFR calc Af Amer: >60 mL/min (ref >60)
GLUCOSE: 82 mg/dL (ref 65–99)
POTASSIUM: 4.1 mmol/L (ref 3.5–5.1)
Sodium: 137 mmol/L (ref 135–145)

## 2018-01-02 LAB — CBC
HCT: 43.2 % (ref 39.0–52.0)
HEMOGLOBIN: 14.8 g/dL (ref 13.0–17.0)
MCH: 31.2 pg (ref 26.0–34.0)
MCHC: 34.3 g/dL (ref 30.0–36.0)
MCV: 90.9 fL (ref 78.0–100.0)
PLATELETS: 193 10*3/uL (ref 150–400)
RBC: 4.75 MIL/uL (ref 4.22–5.81)
RDW: 12.5 % (ref 11.5–15.5)
WBC: 6.4 10*3/uL (ref 4.0–10.5)

## 2018-01-02 NOTE — Progress Notes (Signed)
Patient provided with prep instructions from office, as he is unsure of where his copy is.  Advised to follow to the T.

## 2018-01-09 ENCOUNTER — Encounter (HOSPITAL_COMMUNITY)
Admission: RE | Disposition: A | Discharge status: Home / Self Care | Payer: Self-pay | Source: Ambulatory Visit | Attending: Gastroenterology

## 2018-01-09 ENCOUNTER — Encounter (HOSPITAL_COMMUNITY): Payer: Self-pay | Admitting: Anesthesiology

## 2018-01-09 ENCOUNTER — Ambulatory Visit (HOSPITAL_COMMUNITY): Payer: PPO | Admitting: Anesthesiology

## 2018-01-09 ENCOUNTER — Ambulatory Visit (HOSPITAL_COMMUNITY)
Admission: RE | Admit: 2018-01-09 | Discharge: 2018-01-09 | Disposition: A | Discharge status: Home / Self Care | Payer: PPO | Source: Ambulatory Visit | Attending: Gastroenterology | Admitting: Gastroenterology

## 2018-01-09 DIAGNOSIS — D126 Benign neoplasm of colon, unspecified: Secondary | ICD-10-CM | POA: Diagnosis not present

## 2018-01-09 DIAGNOSIS — K648 Other hemorrhoids: Secondary | ICD-10-CM | POA: Diagnosis not present

## 2018-01-09 DIAGNOSIS — K644 Residual hemorrhoidal skin tags: Secondary | ICD-10-CM | POA: Insufficient documentation

## 2018-01-09 DIAGNOSIS — K5730 Diverticulosis of large intestine without perforation or abscess without bleeding: Secondary | ICD-10-CM | POA: Insufficient documentation

## 2018-01-09 DIAGNOSIS — Z79899 Other long term (current) drug therapy: Secondary | ICD-10-CM | POA: Diagnosis not present

## 2018-01-09 DIAGNOSIS — D123 Benign neoplasm of transverse colon: Secondary | ICD-10-CM | POA: Insufficient documentation

## 2018-01-09 DIAGNOSIS — D125 Benign neoplasm of sigmoid colon: Secondary | ICD-10-CM | POA: Insufficient documentation

## 2018-01-09 DIAGNOSIS — Z1212 Encounter for screening for malignant neoplasm of rectum: Secondary | ICD-10-CM

## 2018-01-09 DIAGNOSIS — Z1211 Encounter for screening for malignant neoplasm of colon: Secondary | ICD-10-CM | POA: Insufficient documentation

## 2018-01-09 DIAGNOSIS — F419 Anxiety disorder, unspecified: Secondary | ICD-10-CM | POA: Insufficient documentation

## 2018-01-09 DIAGNOSIS — K635 Polyp of colon: Secondary | ICD-10-CM

## 2018-01-09 HISTORY — PX: POLYPECTOMY: SHX5525

## 2018-01-09 HISTORY — PX: COLONOSCOPY WITH PROPOFOL: SHX5780

## 2018-01-09 SURGERY — COLONOSCOPY WITH PROPOFOL
Anesthesia: Monitor Anesthesia Care

## 2018-01-09 MED ORDER — PROPOFOL 10 MG/ML IV BOLUS
INTRAVENOUS | Status: AC
  Filled 2018-01-09: qty 20

## 2018-01-09 MED ORDER — EPHEDRINE SULFATE 50 MG/ML IJ SOLN
INTRAMUSCULAR | Status: DC | PRN
  Administered 2018-01-09 (×2): 5 mg via INTRAVENOUS
  Administered 2018-01-09: 10 mg via INTRAVENOUS

## 2018-01-09 MED ORDER — MIDAZOLAM HCL 2 MG/2ML IJ SOLN
INTRAMUSCULAR | Status: DC | PRN
  Administered 2018-01-09: 2 mg via INTRAVENOUS

## 2018-01-09 MED ORDER — FENTANYL CITRATE (PF) 250 MCG/5ML IJ SOLN
INTRAMUSCULAR | Status: DC | PRN
  Administered 2018-01-09 (×2): 25 ug via INTRAVENOUS

## 2018-01-09 MED ORDER — LACTATED RINGERS IV SOLN
INTRAVENOUS | Status: DC
  Administered 2018-01-09: 1000 mL via INTRAVENOUS

## 2018-01-09 MED ORDER — PROPOFOL 500 MG/50ML IV EMUL
INTRAVENOUS | Status: DC | PRN
  Administered 2018-01-09: 11:00:00 via INTRAVENOUS
  Administered 2018-01-09: 150 ug/kg/min via INTRAVENOUS

## 2018-01-09 MED ORDER — LIDOCAINE HCL (CARDIAC) PF 100 MG/5ML IV SOSY
PREFILLED_SYRINGE | INTRAVENOUS | Status: DC | PRN
  Administered 2018-01-09: 40 mg via INTRAVENOUS

## 2018-01-09 MED ORDER — GLYCOPYRROLATE 0.2 MG/ML IJ SOLN
INTRAMUSCULAR | Status: AC
  Filled 2018-01-09: qty 1

## 2018-01-09 MED ORDER — EPHEDRINE SULFATE 50 MG/ML IJ SOLN
INTRAMUSCULAR | Status: AC
  Filled 2018-01-09: qty 1

## 2018-01-09 MED ORDER — MIDAZOLAM HCL 2 MG/2ML IJ SOLN
INTRAMUSCULAR | Status: AC
  Filled 2018-01-09: qty 2

## 2018-01-09 MED ORDER — PROPOFOL 10 MG/ML IV BOLUS
INTRAVENOUS | Status: DC | PRN
  Administered 2018-01-09: 20 mg via INTRAVENOUS

## 2018-01-09 MED ORDER — STERILE WATER FOR IRRIGATION IR SOLN
Status: DC | PRN
  Administered 2018-01-09: 15 mL

## 2018-01-09 MED ORDER — GLYCOPYRROLATE 0.2 MG/ML IJ SOLN
INTRAMUSCULAR | Status: DC | PRN
  Administered 2018-01-09: 0.2 mg via INTRAVENOUS

## 2018-01-09 MED ORDER — SODIUM CHLORIDE 0.9 % IJ SOLN
INTRAMUSCULAR | Status: AC
  Filled 2018-01-09: qty 10

## 2018-01-09 MED ORDER — LIDOCAINE HCL (PF) 1 % IJ SOLN
INTRAMUSCULAR | Status: AC
  Filled 2018-01-09: qty 5

## 2018-01-09 MED ORDER — FENTANYL CITRATE (PF) 100 MCG/2ML IJ SOLN
INTRAMUSCULAR | Status: AC
  Filled 2018-01-09: qty 2

## 2018-01-09 NOTE — Discharge Instructions (Signed)
You have MODERATE SIZE internal AND EXTERNAL hemorrhoids and diverticulosis IN YOUR LEFT COLON. YOU HAD FOUR POLYPS REMOVED.    DRINK WATER TO KEEP YOUR URINE LIGHT YELLOW.  FOLLOW A HIGH FIBER DIET. AVOID ITEMS THAT CAUSE BLOATING. See info below.  YOUR BIOPSY RESULTS WILL BE AVAILABLE IN 7 DAYS.   USE PREPARATION H FOUR TIMES  A DAY IF NEEDED TO RELIEVE RECTAL PAIN/PRESSURE/BLEEDING.  Next colonoscopy in 5-10 years.  Colonoscopy Care After Read the instructions outlined below and refer to this sheet in the next week. These discharge instructions provide you with general information on caring for yourself after you leave the hospital. While your treatment has been planned according to the most current medical practices available, unavoidable complications occasionally occur. If you have any problems or questions after discharge, call DR. Aimar Shrewsbury, [phone].  ACTIVITY  You may resume your regular activity, but move at a slower pace for the next 24 hours.   Take frequent rest periods for the next 24 hours.   Walking will help get rid of the air and reduce the bloated feeling in your belly (abdomen).   No driving for 24 hours (because of the medicine (anesthesia) used during the test).   You may shower.   Do not sign any important legal documents or operate any machinery for 24 hours (because of the anesthesia used during the test).    NUTRITION  Drink plenty of fluids.   You may resume your normal diet as instructed by your doctor.   Begin with a light meal and progress to your normal diet. Heavy or fried foods are harder to digest and may make you feel sick to your stomach (nauseated).   Avoid alcoholic beverages for 24 hours or as instructed.    MEDICATIONS  You may resume your normal medications.   WHAT YOU CAN EXPECT TODAY  Some feelings of bloating in the abdomen.   Passage of more gas than usual.   Spotting of blood in your stool or on the toilet  paper  .  IF YOU HAD POLYPS REMOVED DURING THE COLONOSCOPY:  Eat a soft diet IF YOU HAVE NAUSEA, BLOATING, ABDOMINAL PAIN, OR VOMITING.    FINDING OUT THE RESULTS OF YOUR TEST Not all test results are available during your visit. DR. Oneida Alar WILL CALL YOU WITHIN 14 DAYS OF YOUR PROCEDUE WITH YOUR RESULTS. Do not assume everything is normal if you have not heard from DR. Kishon Garriga, CALL HER OFFICE AT [phone].  SEEK IMMEDIATE MEDICAL ATTENTION AND CALL THE OFFICE: [phone] IF:  You have more than a spotting of blood in your stool.   Your belly is swollen (abdominal distention).   You are nauseated or vomiting.   You have a temperature over 101F.   You have abdominal pain or discomfort that is severe or gets worse throughout the day.  High-Fiber Diet A high-fiber diet changes your normal diet to include more whole grains, legumes, fruits, and vegetables. Changes in the diet involve replacing refined carbohydrates with unrefined foods. The calorie level of the diet is essentially unchanged. The Dietary Reference Intake (recommended amount) for adult males is 38 grams per day. For adult females, it is 25 grams per day. Pregnant and lactating women should consume 28 grams of fiber per day. Fiber is the intact part of a plant that is not broken down during digestion. Functional fiber is fiber that has been isolated from the plant to provide a beneficial effect in the body. PURPOSE  Increase  stool bulk.   Ease and regulate bowel movements.   Lower cholesterol.   REDUCE RISK OF COLON CANCER  INDICATIONS THAT YOU NEED MORE FIBER  Constipation and hemorrhoids.   Uncomplicated diverticulosis (intestine condition) and irritable bowel syndrome.   Weight management.   As a protective measure against hardening of the arteries (atherosclerosis), diabetes, and cancer.   GUIDELINES FOR INCREASING FIBER IN THE DIET  Start adding fiber to the diet slowly. A gradual increase of about 5  more grams (2 slices of whole-wheat bread, 2 servings of most fruits or vegetables, or 1 bowl of high-fiber cereal) per day is best. Too rapid an increase in fiber may result in constipation, flatulence, and bloating.   Drink enough water and fluids to keep your urine clear or pale yellow. Water, juice, or caffeine-free drinks are recommended. Not drinking enough fluid may cause constipation.   Eat a variety of high-fiber foods rather than one type of fiber.   Try to increase your intake of fiber through using high-fiber foods rather than fiber pills or supplements that contain small amounts of fiber.   The goal is to change the types of food eaten. Do not supplement your present diet with high-fiber foods, but replace foods in your present diet.   INCLUDE A VARIETY OF FIBER SOURCES  Replace refined and processed grains with whole grains, canned fruits with fresh fruits, and incorporate other fiber sources. White rice, white breads, and most bakery goods contain little or no fiber.   Brown whole-grain rice, buckwheat oats, and many fruits and vegetables are all good sources of fiber. These include: broccoli, Brussels sprouts, cabbage, cauliflower, beets, sweet potatoes, white potatoes (skin on), carrots, tomatoes, eggplant, squash, berries, fresh fruits, and dried fruits.   Cereals appear to be the richest source of fiber. Cereal fiber is found in whole grains and bran. Bran is the fiber-rich outer coat of cereal grain, which is largely removed in refining. In whole-grain cereals, the bran remains. In breakfast cereals, the largest amount of fiber is found in those with "bran" in their names. The fiber content is sometimes indicated on the label.   You may need to include additional fruits and vegetables each day.   In baking, for 1 cup white flour, you may use the following substitutions:   1 cup whole-wheat flour minus 2 tablespoons.   1/2 cup white flour plus 1/2 cup whole-wheat flour.    Polyps, Colon  A polyp is extra tissue that grows inside your body. Colon polyps grow in the large intestine. The large intestine, also called the colon, is part of your digestive system. It is a long, hollow tube at the end of your digestive tract where your body makes and stores stool. Most polyps are not dangerous. They are benign. This means they are not cancerous. But over time, some types of polyps can turn into cancer. Polyps that are smaller than a pea are usually not harmful. But larger polyps could someday become or may already be cancerous. To be safe, doctors remove all polyps and test them.   PREVENTION There is not one sure way to prevent polyps. You might be able to lower your risk of getting them if you:  Eat more fruits and vegetables and less fatty food.   Do not smoke.   Avoid alcohol.   Exercise every day.   Lose weight if you are overweight.   Eating more calcium and folate can also lower your risk of getting polyps. Some  foods that are rich in calcium are milk, cheese, and broccoli. Some foods that are rich in folate are chickpeas, kidney beans, and spinach.    Diverticulosis Diverticulosis is a common condition that develops when small pouches (diverticula) form in the wall of the colon. The risk of diverticulosis increases with age. It happens more often in people who eat a low-fiber diet. Most individuals with diverticulosis have no symptoms. Those individuals with symptoms usually experience belly (abdominal) pain, constipation, or loose stools (diarrhea).  HOME CARE INSTRUCTIONS  Increase the amount of fiber in your diet as directed by your caregiver or dietician. This may reduce symptoms of diverticulosis.   Drink at least 6 to 8 glasses of water each day to prevent constipation.   Try not to strain when you have a bowel movement.   Avoiding nuts and seeds to prevent complications is NOT NECESSARY.   FOODS HAVING HIGH FIBER CONTENT INCLUDE:  Fruits.  Apple, peach, pear, tangerine, raisins, prunes.   Vegetables. Brussels sprouts, asparagus, broccoli, cabbage, carrot, cauliflower, romaine lettuce, spinach, summer squash, tomato, winter squash, zucchini.   Starchy Vegetables. Baked beans, kidney beans, lima beans, split peas, lentils, potatoes (with skin).   Grains. Whole wheat bread, brown rice, bran flake cereal, plain oatmeal, white rice, shredded wheat, bran muffins.   SEEK IMMEDIATE MEDICAL CARE IF:  You develop increasing pain or severe bloating.   You have an oral temperature above 101F.   You develop vomiting or bowel movements that are bloody or black.   Hemorrhoids Hemorrhoids are dilated (enlarged) veins around the rectum. Sometimes clots will form in the veins. This makes them swollen and painful. These are called thrombosed hemorrhoids. Causes of hemorrhoids include:  Constipation.   Straining to have a bowel movement.   HEAVY LIFTING  HOME CARE INSTRUCTIONS  Eat a well balanced diet and drink 6 to 8 glasses of water every day to avoid constipation. You may also use a bulk laxative.   Avoid straining to have bowel movements.   Keep anal area dry and clean.   Do not use a donut shaped pillow or sit on the toilet for long periods. This increases blood pooling and pain.   Move your bowels when your body has the urge; this will require less straining and will decrease pain and pressure.

## 2018-01-09 NOTE — Anesthesia Postprocedure Evaluation (Signed)
Anesthesia Post Note  Patient: Micheal Moses  Procedure(s) Performed: COLONOSCOPY WITH PROPOFOL (N/A ) POLYPECTOMY  Patient location during evaluation: PACU Anesthesia Type: MAC Level of consciousness: awake, awake and alert, oriented and patient cooperative Pain management: pain level controlled Vital Signs Assessment: post-procedure vital signs reviewed and stable Respiratory status: spontaneous breathing, nonlabored ventilation, respiratory function stable and patient connected to nasal cannula oxygen Cardiovascular status: stable Postop Assessment: no apparent nausea or vomiting Anesthetic complications: no     Last Vitals:  Vitals:   01/09/18 1000 01/09/18 1030  BP:    Pulse:    Resp:    Temp:    SpO2: 97% 97%    Last Pain:  Vitals:   01/09/18 1044  PainSc: 0-No pain                 Tavious Griesinger L

## 2018-01-09 NOTE — Op Note (Signed)
[facility] Patient Name: Micheal Moses Procedure Date: 01/09/2018 10:31 AM MRN: [id number] Date of Birth: 1951/08/14 Attending MD: Barney Drain MD, MD CSN: [id number] Age: 67 Admit Type: Outpatient Procedure:                Colonoscopy WITH COLD FORCEPS POLYPECTOMY Indications:              Screening for colorectal malignant neoplasm Providers:                Barney Drain MD, MD, Charlsie Quest. Theda Sers RN, RN,                            Nelma Rothman, Technician Referring MD:             Lemmie Evens Medicines:                Propofol per Anesthesia Complications:            No immediate complications. Estimated Blood Loss:     Estimated blood loss was minimal. Procedure:                Pre-Anesthesia Assessment:                           - Prior to the procedure, a History and Physical                            was performed, and patient medications and                            allergies were reviewed. The patient's tolerance of                            previous anesthesia was also reviewed. The risks                            and benefits of the procedure and the sedation                            options and risks were discussed with the patient.                            All questions were answered, and informed consent                            was obtained. Prior Anticoagulants: The patient has                            taken no previous anticoagulant or antiplatelet                            agents. ASA Grade Assessment: II - A patient with                            mild systemic disease. After reviewing the risks  and benefits, the patient was deemed in                            satisfactory condition to undergo the procedure.                            After obtaining informed consent, the colonoscope                            was passed under direct vision. Throughout the                            procedure, the patient's blood pressure,  pulse, and                            oxygen saturations were monitored continuously. The                            EC-[id number] ([id number]) scope was introduced through                            the anus and advanced to the the cecum, identified                            by appendiceal orifice and ileocecal valve. The                            colonoscopy was somewhat difficult due to a                            tortuous colon. Successful completion of the                            procedure was aided by straightening and shortening                            the scope to obtain bowel loop reduction and                            COLOWRAP. The patient tolerated the procedure well.                            The quality of the bowel preparation was excellent.                            The ileocecal valve, appendiceal orifice, and                            rectum were photographed. Scope In: 41:28:78 AM Scope Out: 11:11:53 AM Scope Withdrawal Time: 0 hours 13 minutes 30 seconds  Total Procedure Duration: 0 hours 15 minutes 41 seconds  Findings:      Four sessile polyps were found in the sigmoid colon, splenic flexure and       mid transverse  colon. The polyps were 2 to 4 mm in size. These polyps       were removed with a cold biopsy forceps. Resection and retrieval were       complete.      Multiple small-mouthed diverticula were found in the recto-sigmoid colon       and sigmoid colon.      External and internal hemorrhoids were found during retroflexion. The       hemorrhoids were moderate. Impression:               - Four 2 to 4 mm polyps in the sigmoid colon, at                            the splenic flexure and in the mid transverse                            colon, removed with a cold biopsy forceps. Resected                            and retrieved.                           - Diverticulosis in the recto-sigmoid colon and in                            the sigmoid colon.                            - External and internal hemorrhoids. Moderate Sedation:      Per Anesthesia Care Recommendation:           - Repeat colonoscopy in 5-10 years for surveillance.                           - High fiber diet.                           - Continue present medications.                           - Await pathology results.                           - Patient has a contact number available for                            emergencies. The signs and symptoms of potential                            delayed complications were discussed with the                            patient. Return to normal activities tomorrow.                            Written discharge instructions were provided to the  patient. Procedure Code(s):        --- Professional ---                           [phone], Colonoscopy, flexible; with biopsy, single                            or multiple Diagnosis Code(s):        --- Professional ---                           Z12.11, Encounter for screening for malignant                            neoplasm of colon                           D12.5, Benign neoplasm of sigmoid colon                           D12.3, Benign neoplasm of transverse colon (hepatic                            flexure or splenic flexure)                           K64.8, Other hemorrhoids                           K57.30, Diverticulosis of large intestine without                            perforation or abscess without bleeding CPT copyright 2017 [employer]. All rights reserved. The codes documented in this report are preliminary and upon coder review may  be revised to meet current compliance requirements. Barney Drain, MD Barney Drain MD, MD 01/09/2018 11:17:45 AM This report has been signed electronically. Number of Addenda: 0

## 2018-01-09 NOTE — Anesthesia Preprocedure Evaluation (Signed)
Anesthesia Evaluation  Patient identified by MRN, date of birth, ID band Patient awake    Reviewed: Allergy & Precautions, H&P , NPO status , Patient's Chart, lab work & pertinent test results, reviewed documented beta blocker date and time   Airway Mallampati: II  TM Distance: >3 FB Neck ROM: full    Dental no notable dental hx. (+) Teeth Intact, Chipped   Pulmonary neg pulmonary ROS,    Pulmonary exam normal breath sounds clear to auscultation       Cardiovascular Exercise Tolerance: Good negative cardio ROS Normal cardiovascular exam Rhythm:regular Rate:Normal     Neuro/Psych negative neurological ROS  negative psych ROS   GI/Hepatic negative GI ROS, Neg liver ROS,   Endo/Other  negative endocrine ROS  Renal/GU negative Renal ROS  negative genitourinary   Musculoskeletal   Abdominal   Peds  Hematology negative hematology ROS (+)   Anesthesia Other Findings No clinical complaints; Chronic narcotic use for arthritis  Reproductive/Obstetrics negative OB ROS                             Anesthesia Physical Anesthesia Plan  ASA: II  Anesthesia Plan: MAC   Post-op Pain Management:    Induction:   PONV Risk Score and Plan:   Airway Management Planned:   Additional Equipment:   Intra-op Plan:   Post-operative Plan:   Informed Consent: I have reviewed the patients History and Physical, chart, labs and discussed the procedure including the risks, benefits and alternatives for the proposed anesthesia with the patient or authorized representative who has indicated his/her understanding and acceptance.   Dental Advisory Given  Plan Discussed with: CRNA  Anesthesia Plan Comments:         Anesthesia Quick Evaluation

## 2018-01-09 NOTE — H&P (Signed)
Primary Care Physician:  Lemmie Evens, MD Primary Gastroenterologist:  Dr. Oneida Alar  Pre-Procedure History & Physical: HPI:  Micheal Moses is a 67 y.o. male here for [name].  Past Medical History:  Diagnosis Date  . Anxiety   . Arthritis    rt hand    Past Surgical History:  Procedure Laterality Date  . CARPOMETACARPAL ([facility]) FUSION OF THUMB  12-2011   rt thumb  . CARPOMETACARPEL SUSPENSION PLASTY  08/22/2012   Procedure: CARPOMETACARPEL ([place]) SUSPENSION PLASTY;  Surgeon: Schuyler Amor, MD;  Location: [facility];  Service: Orthopedics;  Laterality: Right;  REDO RIGHT THUMB [facility] ARTHROPLASTY with APL Transfer/Osteophyte Removal/Right Wrist Superfacial Radial Nerve Neuroplasty     Prior to Admission medications   Medication Sig Start Date End Date Taking? Authorizing Provider  ALPRAZolam Duanne Moron) 0.5 MG tablet Take 0.5 mg by mouth at bedtime as needed for sleep.  11/01/17  Yes [provider]  oxyCODONE-acetaminophen (ROXICET) 5-325 MG per tablet Take 1 tablet by mouth every 4 (four) hours as needed for pain. Patient taking differently: Take 1 tablet by mouth every 6 (six) hours as needed for severe pain.  08/22/12  Yes Charlotte Crumb, MD  polyethylene glycol-electrolytes (TRILYTE) 420 g solution Take 4,000 mLs by mouth as directed. 11/08/17   Danie Binder, MD    Allergies as of 11/08/2017 - Review Complete 11/08/2017  Allergen Reaction Noted  . Other Nausea And Vomiting 11/08/2017    Family History  Problem Relation Age of Onset  . Colon cancer Neg Hx     Social History   Socioeconomic History  . Marital status: Married    Spouse name: Not on file  . Number of children: Not on file  . Years of education: Not on file  . Highest education level: Not on file  Occupational History  . Not on file  Social Needs  . Financial resource strain: Not on file  . Food insecurity:    Worry: Not on file    Inability: Not on file  .  Transportation needs:    Medical: Not on file    Non-medical: Not on file  Tobacco Use  . Smoking status: Never Smoker  . Smokeless tobacco: Never Used  Substance and Sexual Activity  . Alcohol use: Yes    Comment: beer a day  . Drug use: Yes    Types: Marijuana    Comment: occasionally; once every 2-3 months.  . Sexual activity: Not on file  Lifestyle  . Physical activity:    Days per week: Not on file    Minutes per session: Not on file  . Stress: Not on file  Relationships  . Social connections:    Talks on phone: Not on file    Gets together: Not on file    Attends religious service: Not on file    Active member of club or organization: Not on file    Attends meetings of clubs or organizations: Not on file    Relationship status: Not on file  . Intimate partner violence:    Fear of current or ex partner: Not on file    Emotionally abused: Not on file    Physically abused: Not on file    Forced sexual activity: Not on file  Other Topics Concern  . Not on file  Social History Narrative  . Not on file    Review of Systems: See HPI, otherwise negative ROS   Physical Exam: BP 134/86  Pulse (!) 57   Temp 97.8 F (36.6 C)   Resp 18   SpO2 97%  General:   Alert,  pleasant and cooperative in NAD Head:  Normocephalic and atraumatic. Neck:  Supple; Lungs:  Clear throughout to auscultation.    Heart:  Regular rate and rhythm. Abdomen:  Soft, nontender and nondistended. Normal bowel sounds, without guarding, and without rebound.   Neurologic:  Alert and  oriented x4;  grossly normal neurologically.  Impression/Plan:     SCREENING  Plan:  1. TCS TODAY. DISCUSSED PROCEDURE, BENEFITS, & RISKS: < 1% chance of medication reaction, bleeding, perforation, or rupture of spleen/liver.

## 2018-01-09 NOTE — Transfer of Care (Signed)
Immediate Anesthesia Transfer of Care Note  Patient: Micheal Moses  Procedure(s) Performed: COLONOSCOPY WITH PROPOFOL (N/A ) POLYPECTOMY  Patient Location: PACU  Anesthesia Type:MAC  Level of Consciousness: awake, alert , oriented, drowsy and patient cooperative  Airway & Oxygen Therapy: Patient Spontanous Breathing and Patient connected to nasal cannula oxygen  Post-op Assessment: Report given to RN and Post -op Vital signs reviewed and stable  Post vital signs: Reviewed and stable  Last Vitals:  Vitals Value Taken Time  BP 117/55 01/09/2018 11:18 AM  Temp    Pulse 101 01/09/2018 11:20 AM  Resp 18 01/09/2018 11:20 AM  SpO2 100 % 01/09/2018 11:20 AM  Vitals shown include unvalidated device data.  Last Pain:  Vitals:   01/09/18 1044  PainSc: 0-No pain         Complications: No apparent anesthesia complications

## 2018-01-11 ENCOUNTER — Encounter (HOSPITAL_COMMUNITY): Payer: Self-pay | Admitting: Gastroenterology

## 2018-01-18 ENCOUNTER — Telehealth: Payer: Self-pay | Admitting: Gastroenterology

## 2018-01-18 NOTE — Telephone Encounter (Signed)
LMOM to call.

## 2018-01-18 NOTE — Telephone Encounter (Signed)
TWO SIMPLE ADENOMAS, ONE SERRATED ADENOMA, AND ONE HYPERPLASTIC POLYP REMOVED. NEXT COLONOSCOPY IN 3 YEARS BUT NOT 5-10 YEARS.

## 2018-01-22 NOTE — Telephone Encounter (Signed)
LMOM to call. Mailing a letter also to call.  

## 2018-01-22 NOTE — Telephone Encounter (Signed)
Pt called and is aware.  

## 2018-02-14 DIAGNOSIS — M79641 Pain in right hand: Secondary | ICD-10-CM | POA: Diagnosis not present

## 2018-02-14 DIAGNOSIS — M25561 Pain in right knee: Secondary | ICD-10-CM | POA: Diagnosis not present

## 2018-02-14 DIAGNOSIS — Z79891 Long term (current) use of opiate analgesic: Secondary | ICD-10-CM | POA: Diagnosis not present

## 2018-05-07 DIAGNOSIS — M25561 Pain in right knee: Secondary | ICD-10-CM | POA: Diagnosis not present

## 2018-05-07 DIAGNOSIS — M79641 Pain in right hand: Secondary | ICD-10-CM | POA: Diagnosis not present

## 2018-05-07 DIAGNOSIS — Z79891 Long term (current) use of opiate analgesic: Secondary | ICD-10-CM | POA: Diagnosis not present

## 2018-08-01 DIAGNOSIS — M25561 Pain in right knee: Secondary | ICD-10-CM | POA: Diagnosis not present

## 2018-08-01 DIAGNOSIS — F419 Anxiety disorder, unspecified: Secondary | ICD-10-CM | POA: Diagnosis not present

## 2018-08-01 DIAGNOSIS — M79641 Pain in right hand: Secondary | ICD-10-CM | POA: Diagnosis not present

## 2018-10-03 DIAGNOSIS — G4700 Insomnia, unspecified: Secondary | ICD-10-CM | POA: Diagnosis not present

## 2018-10-03 DIAGNOSIS — E663 Overweight: Secondary | ICD-10-CM | POA: Diagnosis not present

## 2018-10-03 DIAGNOSIS — M25561 Pain in right knee: Secondary | ICD-10-CM | POA: Diagnosis not present

## 2018-10-03 DIAGNOSIS — C44509 Unspecified malignant neoplasm of skin of other part of trunk: Secondary | ICD-10-CM | POA: Diagnosis not present

## 2018-11-20 DIAGNOSIS — Z79891 Long term (current) use of opiate analgesic: Secondary | ICD-10-CM | POA: Diagnosis not present

## 2018-11-20 DIAGNOSIS — F419 Anxiety disorder, unspecified: Secondary | ICD-10-CM | POA: Diagnosis not present

## 2018-11-20 DIAGNOSIS — M19041 Primary osteoarthritis, right hand: Secondary | ICD-10-CM | POA: Diagnosis not present

## 2018-11-20 DIAGNOSIS — M19042 Primary osteoarthritis, left hand: Secondary | ICD-10-CM | POA: Diagnosis not present

## 2019-01-21 DIAGNOSIS — M542 Cervicalgia: Secondary | ICD-10-CM | POA: Diagnosis not present

## 2019-01-21 DIAGNOSIS — M25562 Pain in left knee: Secondary | ICD-10-CM | POA: Diagnosis not present

## 2019-01-21 DIAGNOSIS — Z79891 Long term (current) use of opiate analgesic: Secondary | ICD-10-CM | POA: Diagnosis not present

## 2019-01-21 DIAGNOSIS — M25561 Pain in right knee: Secondary | ICD-10-CM | POA: Diagnosis not present

## 2019-03-22 DIAGNOSIS — M542 Cervicalgia: Secondary | ICD-10-CM | POA: Diagnosis not present

## 2019-03-22 DIAGNOSIS — M25562 Pain in left knee: Secondary | ICD-10-CM | POA: Diagnosis not present

## 2019-03-22 DIAGNOSIS — M25561 Pain in right knee: Secondary | ICD-10-CM | POA: Diagnosis not present

## 2019-03-22 DIAGNOSIS — Z79891 Long term (current) use of opiate analgesic: Secondary | ICD-10-CM | POA: Diagnosis not present

## 2019-06-11 DIAGNOSIS — M25561 Pain in right knee: Secondary | ICD-10-CM | POA: Diagnosis not present

## 2019-06-11 DIAGNOSIS — Z79891 Long term (current) use of opiate analgesic: Secondary | ICD-10-CM | POA: Diagnosis not present

## 2019-06-11 DIAGNOSIS — K029 Dental caries, unspecified: Secondary | ICD-10-CM | POA: Diagnosis not present

## 2019-08-06 DIAGNOSIS — M5431 Sciatica, right side: Secondary | ICD-10-CM | POA: Diagnosis not present

## 2019-08-06 DIAGNOSIS — M25561 Pain in right knee: Secondary | ICD-10-CM | POA: Diagnosis not present

## 2019-08-06 DIAGNOSIS — M5432 Sciatica, left side: Secondary | ICD-10-CM | POA: Diagnosis not present

## 2019-08-06 DIAGNOSIS — Z79891 Long term (current) use of opiate analgesic: Secondary | ICD-10-CM | POA: Diagnosis not present

## 2019-08-30 DIAGNOSIS — Z125 Encounter for screening for malignant neoplasm of prostate: Secondary | ICD-10-CM | POA: Diagnosis not present

## 2019-08-30 DIAGNOSIS — Z79891 Long term (current) use of opiate analgesic: Secondary | ICD-10-CM | POA: Diagnosis not present

## 2019-08-30 DIAGNOSIS — Z13220 Encounter for screening for lipoid disorders: Secondary | ICD-10-CM | POA: Diagnosis not present

## 2019-08-30 DIAGNOSIS — G4700 Insomnia, unspecified: Secondary | ICD-10-CM | POA: Diagnosis not present

## 2019-08-30 DIAGNOSIS — M25561 Pain in right knee: Secondary | ICD-10-CM | POA: Diagnosis not present

## 2019-08-30 DIAGNOSIS — Z87442 Personal history of urinary calculi: Secondary | ICD-10-CM | POA: Diagnosis not present

## 2019-08-30 DIAGNOSIS — C44509 Unspecified malignant neoplasm of skin of other part of trunk: Secondary | ICD-10-CM | POA: Diagnosis not present

## 2019-08-30 DIAGNOSIS — M79641 Pain in right hand: Secondary | ICD-10-CM | POA: Diagnosis not present

## 2019-08-30 DIAGNOSIS — E663 Overweight: Secondary | ICD-10-CM | POA: Diagnosis not present

## 2019-10-09 DIAGNOSIS — Z20828 Contact with and (suspected) exposure to other viral communicable diseases: Secondary | ICD-10-CM | POA: Diagnosis not present

## 2019-10-22 DIAGNOSIS — Z7189 Other specified counseling: Secondary | ICD-10-CM | POA: Diagnosis not present

## 2019-10-22 DIAGNOSIS — M25561 Pain in right knee: Secondary | ICD-10-CM | POA: Diagnosis not present

## 2019-10-22 DIAGNOSIS — G4700 Insomnia, unspecified: Secondary | ICD-10-CM | POA: Diagnosis not present

## 2019-10-22 DIAGNOSIS — Z79891 Long term (current) use of opiate analgesic: Secondary | ICD-10-CM | POA: Diagnosis not present

## 2019-11-08 ENCOUNTER — Ambulatory Visit: Payer: PPO | Attending: Internal Medicine

## 2019-11-08 DIAGNOSIS — Z23 Encounter for immunization: Secondary | ICD-10-CM | POA: Insufficient documentation

## 2019-11-08 NOTE — Progress Notes (Signed)
   Covid-19 Vaccination Clinic  Name:  Micheal Moses    MRN: [id number] DOB: 02/02/1951  11/08/2019  [name] was observed post Covid-19 immunization for 15 minutes without incidence. He was provided with Vaccine Information Sheet and instruction to access the V-Safe system.   [name] was instructed to call 911 with any severe reactions post vaccine: Marland Kitchen Difficulty breathing  . Swelling of your face and throat  . A fast heartbeat  . A bad rash all over your body  . Dizziness and weakness    Immunizations Administered    Name Date Dose VIS Date Route   Pfizer COVID-19 Vaccine 11/08/2019  2:51 PM 0.3 mL 08/23/2019 Intramuscular   Manufacturer: [place]   Lot: [id number]   [place]: [id number]

## 2019-12-04 ENCOUNTER — Ambulatory Visit: Payer: PPO | Attending: Internal Medicine

## 2019-12-04 DIAGNOSIS — Z23 Encounter for immunization: Secondary | ICD-10-CM

## 2019-12-04 NOTE — Progress Notes (Signed)
   Covid-19 Vaccination Clinic  Name:  Micheal Moses    MRN: [id number] DOB: 1950/11/30  12/04/2019  [name] was observed post Covid-19 immunization for 15 minutes without incident. He was provided with Vaccine Information Sheet and instruction to access the V-Safe system.   [name] was instructed to call 911 with any severe reactions post vaccine: Marland Kitchen Difficulty breathing  . Swelling of face and throat  . A fast heartbeat  . A bad rash all over body  . Dizziness and weakness   Immunizations Administered    Name Date Dose VIS Date Route   Pfizer COVID-19 Vaccine 12/04/2019  3:47 PM 0.3 mL 08/23/2019 Intramuscular   Manufacturer: [place]   Lot: [id number]   [place]: [id number]

## 2019-12-17 DIAGNOSIS — Z79891 Long term (current) use of opiate analgesic: Secondary | ICD-10-CM | POA: Diagnosis not present

## 2019-12-17 DIAGNOSIS — M25561 Pain in right knee: Secondary | ICD-10-CM | POA: Diagnosis not present

## 2019-12-17 DIAGNOSIS — G4700 Insomnia, unspecified: Secondary | ICD-10-CM | POA: Diagnosis not present

## 2020-02-11 DIAGNOSIS — M25561 Pain in right knee: Secondary | ICD-10-CM | POA: Diagnosis not present

## 2020-02-11 DIAGNOSIS — R030 Elevated blood-pressure reading, without diagnosis of hypertension: Secondary | ICD-10-CM | POA: Diagnosis not present

## 2020-02-11 DIAGNOSIS — M79641 Pain in right hand: Secondary | ICD-10-CM | POA: Diagnosis not present

## 2020-02-11 DIAGNOSIS — Z79891 Long term (current) use of opiate analgesic: Secondary | ICD-10-CM | POA: Diagnosis not present

## 2020-04-25 DIAGNOSIS — Z20822 Contact with and (suspected) exposure to covid-19: Secondary | ICD-10-CM | POA: Diagnosis not present

## 2020-05-06 DIAGNOSIS — M25511 Pain in right shoulder: Secondary | ICD-10-CM | POA: Diagnosis not present

## 2020-05-06 DIAGNOSIS — Z7189 Other specified counseling: Secondary | ICD-10-CM | POA: Diagnosis not present

## 2020-05-06 DIAGNOSIS — M79641 Pain in right hand: Secondary | ICD-10-CM | POA: Diagnosis not present

## 2020-05-06 DIAGNOSIS — M25561 Pain in right knee: Secondary | ICD-10-CM | POA: Diagnosis not present

## 2020-07-28 DIAGNOSIS — M79641 Pain in right hand: Secondary | ICD-10-CM | POA: Diagnosis not present

## 2020-07-28 DIAGNOSIS — M25561 Pain in right knee: Secondary | ICD-10-CM | POA: Diagnosis not present

## 2020-07-28 DIAGNOSIS — Z79891 Long term (current) use of opiate analgesic: Secondary | ICD-10-CM | POA: Diagnosis not present

## 2020-09-02 DIAGNOSIS — M79641 Pain in right hand: Secondary | ICD-10-CM | POA: Diagnosis not present

## 2020-09-02 DIAGNOSIS — Z13220 Encounter for screening for lipoid disorders: Secondary | ICD-10-CM | POA: Diagnosis not present

## 2020-09-02 DIAGNOSIS — Z125 Encounter for screening for malignant neoplasm of prostate: Secondary | ICD-10-CM | POA: Diagnosis not present

## 2020-09-02 DIAGNOSIS — C44509 Unspecified malignant neoplasm of skin of other part of trunk: Secondary | ICD-10-CM | POA: Diagnosis not present

## 2020-09-02 DIAGNOSIS — E663 Overweight: Secondary | ICD-10-CM | POA: Diagnosis not present

## 2020-09-02 DIAGNOSIS — Z79891 Long term (current) use of opiate analgesic: Secondary | ICD-10-CM | POA: Diagnosis not present

## 2020-09-02 DIAGNOSIS — Z87442 Personal history of urinary calculi: Secondary | ICD-10-CM | POA: Diagnosis not present

## 2020-09-02 DIAGNOSIS — G4700 Insomnia, unspecified: Secondary | ICD-10-CM | POA: Diagnosis not present

## 2020-09-02 DIAGNOSIS — M25561 Pain in right knee: Secondary | ICD-10-CM | POA: Diagnosis not present

## 2020-10-20 DIAGNOSIS — Z7189 Other specified counseling: Secondary | ICD-10-CM | POA: Diagnosis not present

## 2020-10-20 DIAGNOSIS — Z79891 Long term (current) use of opiate analgesic: Secondary | ICD-10-CM | POA: Diagnosis not present

## 2020-10-20 DIAGNOSIS — M79641 Pain in right hand: Secondary | ICD-10-CM | POA: Diagnosis not present

## 2020-10-20 DIAGNOSIS — M79642 Pain in left hand: Secondary | ICD-10-CM | POA: Diagnosis not present

## 2020-11-11 ENCOUNTER — Ambulatory Visit: Payer: PPO | Admitting: Physician Assistant

## 2020-12-01 ENCOUNTER — Encounter: Payer: Self-pay | Admitting: Internal Medicine

## 2021-01-13 DIAGNOSIS — M25561 Pain in right knee: Secondary | ICD-10-CM | POA: Diagnosis not present

## 2021-01-13 DIAGNOSIS — R252 Cramp and spasm: Secondary | ICD-10-CM | POA: Diagnosis not present

## 2021-01-13 DIAGNOSIS — Z79891 Long term (current) use of opiate analgesic: Secondary | ICD-10-CM | POA: Diagnosis not present

## 2021-01-13 DIAGNOSIS — M79641 Pain in right hand: Secondary | ICD-10-CM | POA: Diagnosis not present

## 2021-04-20 DIAGNOSIS — Z79891 Long term (current) use of opiate analgesic: Secondary | ICD-10-CM | POA: Diagnosis not present

## 2021-04-20 DIAGNOSIS — M25561 Pain in right knee: Secondary | ICD-10-CM | POA: Diagnosis not present

## 2021-04-20 DIAGNOSIS — M79641 Pain in right hand: Secondary | ICD-10-CM | POA: Diagnosis not present

## 2021-07-26 DIAGNOSIS — Z79891 Long term (current) use of opiate analgesic: Secondary | ICD-10-CM | POA: Diagnosis not present

## 2021-07-26 DIAGNOSIS — R030 Elevated blood-pressure reading, without diagnosis of hypertension: Secondary | ICD-10-CM | POA: Diagnosis not present

## 2021-07-26 DIAGNOSIS — M25561 Pain in right knee: Secondary | ICD-10-CM | POA: Diagnosis not present

## 2021-07-26 DIAGNOSIS — R42 Dizziness and giddiness: Secondary | ICD-10-CM | POA: Diagnosis not present

## 2021-08-02 ENCOUNTER — Other Ambulatory Visit (HOSPITAL_COMMUNITY): Payer: Self-pay | Admitting: Family Medicine

## 2021-08-02 ENCOUNTER — Other Ambulatory Visit: Payer: Self-pay | Admitting: Family Medicine

## 2021-08-02 DIAGNOSIS — M436 Torticollis: Secondary | ICD-10-CM

## 2021-08-02 DIAGNOSIS — M542 Cervicalgia: Secondary | ICD-10-CM

## 2021-08-10 ENCOUNTER — Other Ambulatory Visit: Payer: Self-pay

## 2021-08-10 ENCOUNTER — Ambulatory Visit (HOSPITAL_COMMUNITY)
Admission: RE | Admit: 2021-08-10 | Discharge: 2021-08-10 | Disposition: A | Discharge status: Home / Self Care | Payer: PPO | Source: Ambulatory Visit | Attending: Family Medicine | Admitting: Family Medicine

## 2021-08-10 DIAGNOSIS — M436 Torticollis: Secondary | ICD-10-CM | POA: Insufficient documentation

## 2021-08-10 DIAGNOSIS — I6523 Occlusion and stenosis of bilateral carotid arteries: Secondary | ICD-10-CM | POA: Diagnosis not present

## 2021-08-10 DIAGNOSIS — M542 Cervicalgia: Secondary | ICD-10-CM | POA: Insufficient documentation

## 2021-08-20 DIAGNOSIS — R42 Dizziness and giddiness: Secondary | ICD-10-CM | POA: Diagnosis not present

## 2021-08-20 DIAGNOSIS — Z832 Family history of diseases of the blood and blood-forming organs and certain disorders involving the immune mechanism: Secondary | ICD-10-CM | POA: Diagnosis not present

## 2021-08-20 DIAGNOSIS — Z823 Family history of stroke: Secondary | ICD-10-CM | POA: Diagnosis not present

## 2021-08-25 ENCOUNTER — Encounter: Payer: Self-pay | Admitting: Internal Medicine

## 2021-09-01 ENCOUNTER — Other Ambulatory Visit (HOSPITAL_COMMUNITY): Payer: Self-pay | Admitting: Family Medicine

## 2021-09-01 ENCOUNTER — Other Ambulatory Visit: Payer: Self-pay | Admitting: Family Medicine

## 2021-09-01 DIAGNOSIS — R42 Dizziness and giddiness: Secondary | ICD-10-CM

## 2021-09-15 ENCOUNTER — Ambulatory Visit (HOSPITAL_COMMUNITY)
Admission: RE | Admit: 2021-09-15 | Discharge: 2021-09-15 | Disposition: A | Discharge status: Home / Self Care | Payer: PPO | Source: Ambulatory Visit | Attending: Family Medicine | Admitting: Family Medicine

## 2021-09-15 ENCOUNTER — Other Ambulatory Visit: Payer: Self-pay

## 2021-09-15 DIAGNOSIS — I6501 Occlusion and stenosis of right vertebral artery: Secondary | ICD-10-CM | POA: Diagnosis not present

## 2021-09-15 DIAGNOSIS — R42 Dizziness and giddiness: Secondary | ICD-10-CM | POA: Diagnosis present

## 2021-12-23 NOTE — Progress Notes (Signed)
? ?Referring Provider: Lemmie Evens, MD ?Primary Care Physician:  Lemmie Evens, MD ?Primary Gastroenterologist:  Dr. Abbey Chatters ? ?Chief Complaint  ?Patient presents with  ? Colonoscopy  ? ? ?HPI:   ?Micheal Moses is a 71 y.o. male presenting today at the request of Lemmie Evens, MD for abnormal labs.  Notably, patient is also due overdue for surveillance colonoscopy.  Last colonoscopyApril 2019 with 4 polyps removed, diverticulosis in the rectosigmoid and sigmoid colon, external and internal hemorrhoids.  Pathology revealed sessile serrated polyp, hyperplastic polyp, tubular adenoma.  Recommended repeat colonoscopy in 3 years. ? ? ?Today:  ?Reports he is doing well.  No GI concerns.  Denies abdominal pain, constipation, diarrhea, BRBPR, melena, unintentional weight loss, nausea, vomiting, dysphagia, reflux symptoms. ? ?Per review of referral information, states patient has family history of hemochromatosis with recommendations to see GI.  No blood work was included.  Patient is unaware of any abnormal lab results.  He does tell me today that he has 2-3 sisters and 1 brother with hemochromatosis, all required phlebotomy.  He has a total of 14 brothers and sisters. ? ? ?Past Medical History:  ?Diagnosis Date  ? Anxiety   ? Arthritis   ? rt hand, knees  ? ? ?Past Surgical History:  ?Procedure Laterality Date  ? CARPOMETACARPAL ([place]) FUSION OF THUMB  12/12/2011  ? rt thumb  ? Foley PLASTY  08/22/2012  ? Procedure: CARPOMETACARPEL [facility]) SUSPENSION PLASTY;  Surgeon: Schuyler Amor, MD;  Location: [facility];  Service: Orthopedics;  Laterality: Right;  REDO RIGHT THUMB [facility] ARTHROPLASTY with APL Transfer/Osteophyte Removal/Right Wrist Superfacial Radial Nerve Neuroplasty   ? COLONOSCOPY WITH PROPOFOL N/A 01/09/2018  ? Surgeon: Danie Binder, MD;  polyps removed, diverticulosis in the rectosigmoid and sigmoid colon, external and internal hemorrhoids.  Pathology revealed sessile  serrated polyp, hyperplastic polyp, tubular adenoma.  Recommended repeat colonoscopy in 3 years.  ? POLYPECTOMY  01/09/2018  ? Procedure: POLYPECTOMY;  Surgeon: Danie Binder, MD;  [facility];  Service: Endoscopy;;  Transverse colon x1, splenic flexure x1, sigmoid colon x2  ? ? ?Current Outpatient Medications  ?Medication Sig Dispense Refill  ? ALPRAZolam (XANAX) 0.5 MG tablet Take 0.5 mg by mouth at bedtime as needed for sleep.     ? cholecalciferol (VITAMIN D3) 25 MCG (1000 UNIT) tablet Take 1,000 Units by mouth daily.    ? oxyCODONE-acetaminophen (ROXICET) 5-325 MG per tablet Take 1 tablet by mouth every 4 (four) hours as needed for pain. (Patient taking differently: Take 1 tablet by mouth every 6 (six) hours as needed for severe pain.) 30 tablet 0  ? ?No current facility-administered medications for this visit.  ? ? ?Allergies as of 12/24/2021 - Review Complete 12/24/2021  ?Allergen Reaction Noted  ? Other Nausea And Vomiting 11/08/2017  ? ? ?Family History  ?Problem Relation Age of Onset  ? Colon cancer Neg Hx   ? ? ?Social History  ? ?Socioeconomic History  ? Marital status: Married  ?  Spouse name: Not on file  ? Number of children: Not on file  ? Years of education: Not on file  ? Highest education level: Not on file  ?Occupational History  ? Not on file  ?Tobacco Use  ? Smoking status: Never  ? Smokeless tobacco: Never  ?Vaping Use  ? Vaping Use: Never used  ?Substance and Sexual Activity  ? Alcohol use: Yes  ?  Comment: 3 beer a day  ? Drug use:  Yes  ?  Types: Marijuana  ?  Comment: hardly ever  ? Sexual activity: Not on file  ?Other Topics Concern  ? Not on file  ?Social History Narrative  ? Not on file  ? ?Social Determinants of Health  ? ?Financial Resource Strain: Not on file  ?Food Insecurity: Not on file  ?Transportation Needs: Not on file  ?Physical Activity: Not on file  ?Stress: Not on file  ?Social Connections: Not on file  ?Intimate Partner Violence: Not on file  ? ? ?Review of  Systems: ?Gen: Denies any fever, chills, cold or flulike symptoms, presyncope, syncope. ?CV: Denies chest pain, heart palpitations. ?Resp: Denies shortness of breath or cough. ?GI: See HPI ?GU : Denies urinary burning, urinary frequency, urinary hesitancy ?MS: Denies joint pain ?Derm: Denies rash ?Psych: Denies depression, anxiety ?Heme: See HPI ? ?Physical Exam: ?BP 126/74   Pulse 60   Temp 97.6 ?F (36.4 ?C) (Temporal)   Ht '5\' 6"'$  (1.676 m)   Wt 166 lb 9.6 oz (75.6 kg)   BMI 26.89 kg/m?  ?General:   Alert and oriented. Pleasant and cooperative. Well-nourished and well-developed.  ?Head:  Normocephalic and atraumatic. ?Eyes:  Without icterus, sclera clear and conjunctiva pink.  ?Ears:  Normal auditory acuity. ?Lungs:  Clear to auscultation bilaterally. No wheezes, rales, or rhonchi. No distress.  ?Heart:  S1, S2 present without murmurs appreciated.  ?Abdomen:  +BS, soft, non-tender and non-distended. No HSM noted. No guarding or rebound. No masses appreciated.  ?Rectal:  Deferred  ?Msk:  Symmetrical without gross deformities. Normal posture. ?Extremities:  Without edema. ?Neurologic:  Alert and  oriented x4;  grossly normal neurologically. ?Skin:  Intact without significant lesions or rashes. ?Psych:  Normal mood and affect. ? ? ? ?Assessment:  ?71 year old male with history of anxiety, arthritis, colon polyps, presenting today to discuss scheduling surveillance colonoscopy.  Also received referral from Dr. Karie Kirks for "abnormal labs". ? ?History of colon polyps: ?Overdue for surveillance.  Last colonoscopy in April 2019 with 4 polyps removed, diverticulosis in the rectosigmoid and sigmoid colon, external and internal hemorrhoids.  Pathology revealed sessile serrated polyp, hyperplastic polyp, tubular adenoma.  Recommended repeat in 3 years.  Currently without any significant GI symptoms.  No alarm symptoms.  No family history of colon cancer. ? ?Family history of hemochromatosis: ?Patient has 3-4 siblings with  hemochromatosis that require phlebotomy.  He is not sure if he has ever been screened.  We received a referral from Dr. Karie Kirks for "abnormal labs", but no blood work was received.  We will request labs from the last 6 months from Dr. Karie Kirks for review.  Further recommendations to follow.  Would recommend at least obtaining an iron panel for hemochromatosis screening at this has never been checked. ? ? ?Plan:  ?Proceed with colonoscopy with propofol by Dr. Abbey Chatters in near future. The risks, benefits, and alternatives have been discussed with the patient in detail. The patient states understanding and desires to proceed. ?ASA 2 ?Request blood work from last 6 months from Dr. Karie Kirks.  At minimum, patient needs iron panel to screen for hemochromatosis if this has never been done. ?Follow-up as needed. ? ? ?Aliene Altes, PA-C ?[facility] ?12/24/2021 ? ? ?

## 2021-12-24 ENCOUNTER — Ambulatory Visit: Payer: PPO | Admitting: Gastroenterology

## 2021-12-24 ENCOUNTER — Encounter: Payer: Self-pay | Admitting: Gastroenterology

## 2021-12-24 ENCOUNTER — Encounter: Payer: Self-pay | Admitting: *Deleted

## 2021-12-24 VITALS — BP 126/74 | HR 60 | Temp 97.6°F | Ht 66.0 in | Wt 166.6 lb

## 2021-12-24 DIAGNOSIS — Z8349 Family history of other endocrine, nutritional and metabolic diseases: Secondary | ICD-10-CM

## 2021-12-24 DIAGNOSIS — Z86010 Personal history of colonic polyps: Secondary | ICD-10-CM | POA: Diagnosis not present

## 2021-12-24 MED ORDER — PEG 3350-KCL-NA BICARB-NACL 420 G PO SOLR: ORAL | 0 refills | Status: DC

## 2021-12-24 NOTE — Patient Instructions (Signed)
We will arrange you to have a colonoscopy in the near future with Dr. Abbey Chatters. ? ?I am requesting your recent blood work from Dr. Karie Kirks for review.  Once I have reviewed, we will reach out to you with additional recommendations. ? ?It was nice meeting you today!  I am glad you are doing well overall! ? ?Aliene Altes, PA-C ?[facility] ? ?

## 2021-12-27 ENCOUNTER — Telehealth: Payer: Self-pay | Admitting: Internal Medicine

## 2021-12-27 ENCOUNTER — Encounter: Payer: Self-pay | Admitting: *Deleted

## 2021-12-27 NOTE — Telephone Encounter (Signed)
Called pt. Rescheduled to 5/26 at 7:30am. New instructions mailed. Message sent to endo.  ?

## 2021-12-27 NOTE — Telephone Encounter (Signed)
Pt needs to reschedule his procedure with Dr Abbey Chatters on 5/23. [phone] ?

## 2022-01-07 ENCOUNTER — Telehealth: Payer: Self-pay | Admitting: Gastroenterology

## 2022-01-07 NOTE — Telephone Encounter (Signed)
Micheal Moses, can you request labs from the last 6 months from Dr. Karie Kirks?  I believe we requested these labs at the time of patient's office visit on 4/14, but I have not received anything yet. ?

## 2022-01-11 NOTE — Telephone Encounter (Signed)
They came across this morning and should be in your box.  ?

## 2022-01-12 ENCOUNTER — Other Ambulatory Visit: Payer: Self-pay | Admitting: *Deleted

## 2022-01-12 DIAGNOSIS — Z8349 Family history of other endocrine, nutritional and metabolic diseases: Secondary | ICD-10-CM

## 2022-01-12 NOTE — Telephone Encounter (Signed)
Received and reviewed labs dated 10/29/2021, 09/03/2020, 08/30/2019, 10/04/2018, 08/19/2017.  Hemoglobin and LFTs have been normal across all labs.  His creatinine was mildly elevated at 1.36 on 10/29/2021, previously normal.  Iron levels have not been checked. ? ?[place]:  ?Please let patient know that I received and reviewed his labs from his primary care provider.  His hemoglobin has been within normal limits, but it does not appear that his iron has ever been checked.  Due to his strong family history of hereditary hemochromatosis, recommend we update an iron panel and also go ahead and also check for the most common genetic abnormalities that are related to hereditary hemochromatosis. Please arrange iron panel with ferritin and hemochromatosis DNA-PCR (c282y, h63d) ?

## 2022-01-12 NOTE — Telephone Encounter (Signed)
Noted  

## 2022-01-12 NOTE — Telephone Encounter (Signed)
Spoke to pt informed him of results and recommendations. Pt voiced understanding. Informed of labs needed. Will fax to [place] entered into Epic.  ?

## 2022-01-28 LAB — IRON,TIBC AND FERRITIN PANEL
%SAT: 27 % (calc) (ref 20–48)
Ferritin: 206 ng/mL (ref 24–380)
Iron: 77 ug/dL (ref 50–180)
TIBC: 283 mcg/dL (calc) (ref 250–425)

## 2022-01-28 LAB — HEMOCHROMATOSIS DNA-PCR(C282Y,H63D)

## 2022-02-04 ENCOUNTER — Ambulatory Visit (HOSPITAL_COMMUNITY): Payer: PPO | Admitting: Anesthesiology

## 2022-02-04 ENCOUNTER — Encounter (HOSPITAL_COMMUNITY): Payer: Self-pay

## 2022-02-04 ENCOUNTER — Other Ambulatory Visit: Payer: Self-pay

## 2022-02-04 ENCOUNTER — Ambulatory Visit (HOSPITAL_COMMUNITY)
Admission: RE | Admit: 2022-02-04 | Discharge: 2022-02-04 | Disposition: A | Discharge status: Home / Self Care | Payer: PPO | Attending: Internal Medicine | Admitting: Internal Medicine

## 2022-02-04 ENCOUNTER — Encounter (HOSPITAL_COMMUNITY)
Admission: RE | Disposition: A | Discharge status: Home / Self Care | Payer: Self-pay | Source: Home / Self Care | Attending: Internal Medicine

## 2022-02-04 ENCOUNTER — Ambulatory Visit (HOSPITAL_BASED_OUTPATIENT_CLINIC_OR_DEPARTMENT_OTHER): Payer: PPO | Admitting: Anesthesiology

## 2022-02-04 DIAGNOSIS — K5730 Diverticulosis of large intestine without perforation or abscess without bleeding: Secondary | ICD-10-CM

## 2022-02-04 DIAGNOSIS — R69 Illness, unspecified: Secondary | ICD-10-CM

## 2022-02-04 DIAGNOSIS — Z8349 Family history of other endocrine, nutritional and metabolic diseases: Secondary | ICD-10-CM

## 2022-02-04 DIAGNOSIS — K648 Other hemorrhoids: Secondary | ICD-10-CM | POA: Insufficient documentation

## 2022-02-04 DIAGNOSIS — F419 Anxiety disorder, unspecified: Secondary | ICD-10-CM | POA: Insufficient documentation

## 2022-02-04 DIAGNOSIS — Z86010 Personal history of colonic polyps: Secondary | ICD-10-CM | POA: Diagnosis not present

## 2022-02-04 DIAGNOSIS — K644 Residual hemorrhoidal skin tags: Secondary | ICD-10-CM | POA: Insufficient documentation

## 2022-02-04 DIAGNOSIS — M1990 Unspecified osteoarthritis, unspecified site: Secondary | ICD-10-CM | POA: Diagnosis not present

## 2022-02-04 DIAGNOSIS — Z1211 Encounter for screening for malignant neoplasm of colon: Secondary | ICD-10-CM | POA: Insufficient documentation

## 2022-02-04 HISTORY — PX: COLONOSCOPY WITH PROPOFOL: SHX5780

## 2022-02-04 SURGERY — COLONOSCOPY WITH PROPOFOL
Anesthesia: General

## 2022-02-04 MED ORDER — LACTATED RINGERS IV SOLN: INTRAVENOUS | Status: DC

## 2022-02-04 MED ORDER — PROPOFOL 10 MG/ML IV BOLUS
INTRAVENOUS | Status: DC | PRN
  Administered 2022-02-04 (×3): 50 mg via INTRAVENOUS
  Administered 2022-02-04: 100 mg via INTRAVENOUS

## 2022-02-04 MED ORDER — LIDOCAINE HCL (CARDIAC) PF 100 MG/5ML IV SOSY
PREFILLED_SYRINGE | INTRAVENOUS | Status: DC | PRN
  Administered 2022-02-04: 50 mg via INTRAVENOUS

## 2022-02-04 NOTE — Anesthesia Procedure Notes (Signed)
Date/Time: 02/04/2022 7:35 AM Performed by: Orlie Dakin, CRNA Pre-anesthesia Checklist: Patient identified, Emergency Drugs available, Suction available and Patient being monitored Patient Re-evaluated:Patient Re-evaluated prior to induction Oxygen Delivery Method: Nasal cannula Induction Type: IV induction Placement Confirmation: positive ETCO2

## 2022-02-04 NOTE — Transfer of Care (Signed)
Immediate Anesthesia Transfer of Care Note  Patient: Micheal Moses  Procedure(s) Performed: COLONOSCOPY WITH PROPOFOL  Patient Location: Endoscopy Unit  Anesthesia Type:General  Level of Consciousness: drowsy  Airway & Oxygen Therapy: Patient Spontanous Breathing  Post-op Assessment: Report given to RN and Post -op Vital signs reviewed and stable  Post vital signs: Reviewed and stable  Last Vitals:  Vitals Value Taken Time  BP    Temp    Pulse    Resp    SpO2      Last Pain:  Vitals:   02/04/22 0730  TempSrc:   PainSc: 0-No pain      Patients Stated Pain Goal: 8 (83/38/25 0539)  Complications: No notable events documented.

## 2022-02-04 NOTE — Discharge Instructions (Addendum)
  Colonoscopy Discharge Instructions  Read the instructions outlined below and refer to this sheet in the next few weeks. These discharge instructions provide you with general information on caring for yourself after you leave the hospital. Your doctor may also give you specific instructions. While your treatment has been planned according to the most current medical practices available, unavoidable complications occasionally occur.   ACTIVITY You may resume your regular activity, but move at a slower pace for the next 24 hours.  Take frequent rest periods for the next 24 hours.  Walking will help get rid of the air and reduce the bloated feeling in your belly (abdomen).  No driving for 24 hours (because of the medicine (anesthesia) used during the test).   Do not sign any important legal documents or operate any machinery for 24 hours (because of the anesthesia used during the test).  NUTRITION Drink plenty of fluids.  You may resume your normal diet as instructed by your doctor.  Begin with a light meal and progress to your normal diet. Heavy or fried foods are harder to digest and may make you feel sick to your stomach (nauseated).  Avoid alcoholic beverages for 24 hours or as instructed.  MEDICATIONS You may resume your normal medications unless your doctor tells you otherwise.  WHAT YOU CAN EXPECT TODAY Some feelings of bloating in the abdomen.  Passage of more gas than usual.  Spotting of blood in your stool or on the toilet paper.  IF YOU HAD POLYPS REMOVED DURING THE COLONOSCOPY: No aspirin products for 7 days or as instructed.  No alcohol for 7 days or as instructed.  Eat a soft diet for the next 24 hours.  FINDING OUT THE RESULTS OF YOUR TEST Not all test results are available during your visit. If your test results are not back during the visit, make an appointment with your caregiver to find out the results. Do not assume everything is normal if you have not heard from your  caregiver or the medical facility. It is important for you to follow up on all of your test results.  SEEK IMMEDIATE MEDICAL ATTENTION IF: You have more than a spotting of blood in your stool.  Your belly is swollen (abdominal distention).  You are nauseated or vomiting.  You have a temperature over 101.  You have abdominal pain or discomfort that is severe or gets worse throughout the day.   Your colonoscopy was relatively unremarkable.  I did not find any polyps or evidence of colon cancer.  I recommend repeating colonoscopy in 5 years for surveillance purposes..  You do have diverticulosis and internal hemorrhoids. I would recommend increasing fiber in your diet or adding OTC Benefiber/Metamucil. Be sure to drink at least 4 to 6 glasses of water daily. Follow-up with GI as needed.   I hope you have a great rest of your week!  Charles K. Carver, D.O. Gastroenterology and Hepatology [facility]  

## 2022-02-04 NOTE — Anesthesia Postprocedure Evaluation (Signed)
Anesthesia Post Note  Patient: Micheal Moses  Procedure(s) Performed: COLONOSCOPY WITH PROPOFOL  Patient location during evaluation: Phase II Anesthesia Type: General Level of consciousness: awake and alert and oriented Pain management: pain level controlled Vital Signs Assessment: post-procedure vital signs reviewed and stable Respiratory status: spontaneous breathing, nonlabored ventilation and respiratory function stable Cardiovascular status: blood pressure returned to baseline and stable Postop Assessment: no apparent nausea or vomiting Anesthetic complications: no   No notable events documented.   Last Vitals:  Vitals:   02/04/22 0743 02/04/22 0748  BP: (!) 97/52 114/72  Pulse: 60   Resp: 20   Temp: 36.5 C   SpO2: 97%     Last Pain:  Vitals:   02/04/22 0743  TempSrc: Oral  PainSc: 0-No pain                 Miles Leyda C Kiley Torrence

## 2022-02-04 NOTE — Op Note (Signed)
[facility] Patient Name: Micheal Moses Procedure Date: 02/04/2022 7:19 AM MRN: [id number] Date of Birth: 22-Apr-1951 Attending MD: Elon Alas. Abbey Chatters DO CSN: [id number] Age: 71 Admit Type: Outpatient Procedure:                Colonoscopy Indications:              Surveillance: Personal history of adenomatous                            polyps on last colonoscopy > 3 years ago Providers:                Elon Alas. Abbey Chatters, DO, Lambert Mody, Dereck Leep, Technician Referring MD:              Medicines:                See the Anesthesia note for documentation of the                            administered medications Complications:            No immediate complications. Estimated Blood Loss:     Estimated blood loss: none. Procedure:                Pre-Anesthesia Assessment:                           - The anesthesia plan was to use monitored                            anesthesia care (MAC).                           After obtaining informed consent, the colonoscope                            was passed under direct vision. Throughout the                            procedure, the patient's blood pressure, pulse, and                            oxygen saturations were monitored continuously. The                            PCF-HQ190L ([id number]) scope was introduced through                            the anus and advanced to the the cecum, identified                            by appendiceal orifice and ileocecal valve. The                            colonoscopy was performed without  difficulty. The                            patient tolerated the procedure well. The quality                            of the bowel preparation was evaluated using the                            BBPS [facility] Bowel Preparation Scale) with scores                            of: Right Colon = 3, Transverse Colon = 3 and Left                            Colon = 3 (entire  mucosa seen well with no residual                            staining, small fragments of stool or opaque                            liquid). The total BBPS score equals 9. Scope In: 7:30:13 AM Scope Out: 7:40:13 AM Scope Withdrawal Time: 0 hours 6 minutes 31 seconds  Total Procedure Duration: 0 hours 10 minutes 0 seconds  Findings:      The perianal and digital rectal examinations were normal.      Non-bleeding internal hemorrhoids were found during endoscopy.      Multiple small-mouthed diverticula were found in the sigmoid colon.      The exam was otherwise without abnormality. Impression:               - Non-bleeding internal hemorrhoids.                           - Diverticulosis in the sigmoid colon.                           - The examination was otherwise normal.                           - No specimens collected. Moderate Sedation:      Per Anesthesia Care Recommendation:           - Patient has a contact number available for                            emergencies. The signs and symptoms of potential                            delayed complications were discussed with the                            patient. Return to normal activities tomorrow.                            Written discharge instructions  were provided to the                            patient.                           - Resume previous diet.                           - Continue present medications.                           - Repeat colonoscopy in 5 years for surveillance.                           - Return to GI clinic PRN. Procedure Code(s):        --- Professional ---                           U0233, Colorectal cancer screening; colonoscopy on                            individual at high risk Diagnosis Code(s):        --- Professional ---                           Z86.010, Personal history of colonic polyps                           K64.8, Other hemorrhoids                           K57.30, Diverticulosis  of large intestine without                            perforation or abscess without bleeding CPT copyright 2019 American Medical Association. All rights reserved. The codes documented in this report are preliminary and upon coder review may  be revised to meet current compliance requirements. Elon Alas. Abbey Chatters, DO [place] Abbey Chatters, DO 02/04/2022 7:44:05 AM This report has been signed electronically. Number of Addenda: 0

## 2022-02-04 NOTE — H&P (Signed)
Primary Care Physician:  Lemmie Evens, MD Primary Gastroenterologist:  Dr. Abbey Chatters  Pre-Procedure History & Physical: HPI:  DEANTA Moses is a 71 y.o. male is here for a colonoscopy to be performed for surveillance purposes. Last colonoscopyApril 2019 with 4 polyps removed, diverticulosis in the rectosigmoid and sigmoid colon, external and internal hemorrhoids.  Pathology revealed sessile serrated polyp, hyperplastic polyp, tubular adenoma.   Past Medical History:  Diagnosis Date   Anxiety    Arthritis    rt hand, knees    Past Surgical History:  Procedure Laterality Date   CARPOMETACARPAL ([place]) FUSION OF THUMB  12/12/2011   rt thumb   CARPOMETACARPEL SUSPENSION PLASTY  08/22/2012   Procedure: CARPOMETACARPEL ([place]) SUSPENSION PLASTY;  Surgeon: Schuyler Amor, MD;  Location: [place];  Service: Orthopedics;  Laterality: Right;  REDO RIGHT THUMB [facility] ARTHROPLASTY with APL Transfer/Osteophyte Removal/Right Wrist Superfacial Radial Nerve Neuroplasty    COLONOSCOPY WITH PROPOFOL N/A 01/09/2018   Surgeon: Danie Binder, MD;  polyps removed, diverticulosis in the rectosigmoid and sigmoid colon, external and internal hemorrhoids.  Pathology revealed sessile serrated polyp, hyperplastic polyp, tubular adenoma.  Recommended repeat colonoscopy in 3 years.   POLYPECTOMY  01/09/2018   Procedure: POLYPECTOMY;  Surgeon: Danie Binder, MD;  Location[facility];  Service: Endoscopy;;  Transverse colon x1, splenic flexure x1, sigmoid colon x2    Prior to Admission medications   Medication Sig Start Date End Date Taking? Authorizing Provider  ALPRAZolam Duanne Moron) 1 MG tablet Take 1 mg by mouth at bedtime.   Yes [provider]  Cholecalciferol (VITAMIN D) 50 MCG (2000 UT) tablet Take 2,000 Units by mouth daily.   Yes [provider]  naproxen sodium (ALEVE) 220 MG tablet Take 220 mg by mouth 2 (two) times daily as needed (pain).   Yes [provider]   oxyCODONE-acetaminophen (ROXICET) 5-325 MG per tablet Take 1 tablet by mouth every 4 (four) hours as needed for pain. Patient taking differently: Take 1 tablet by mouth every 6 (six) hours as needed for severe pain. 08/22/12  Yes Charlotte Crumb, MD  polyethylene glycol-electrolytes (NULYTELY) 420 g solution As directed 12/24/21  Yes Eloise Harman, DO    Allergies as of 12/24/2021 - Review Complete 12/24/2021  Allergen Reaction Noted   Other Nausea And Vomiting 11/08/2017    Family History  Problem Relation Age of Onset   Colon cancer Neg Hx     Social History   Socioeconomic History   Marital status: Married    Spouse name: Not on file   Number of children: Not on file   Years of education: Not on file   Highest education level: Not on file  Occupational History   Not on file  Tobacco Use   Smoking status: Never   Smokeless tobacco: Never  Vaping Use   Vaping Use: Never used  Substance and Sexual Activity   Alcohol use: Yes    Comment: 3 beer a day   Drug use: Yes    Types: Marijuana    Comment: hardly ever   Sexual activity: Not on file  Other Topics Concern   Not on file  Social History Narrative   Not on file   Social Determinants of Health   Financial Resource Strain: Not on file  Food Insecurity: Not on file  Transportation Needs: Not on file  Physical Activity: Not on file  Stress: Not on file  Social Connections: Not on file  Intimate Partner  Violence: Not on file    Review of Systems: See HPI, otherwise negative ROS  Physical Exam: Vital signs in last 24 hours: Temp:  [97.7 F (36.5 C)] 97.7 F (36.5 C) (05/26 0641) Pulse Rate:  [68] 68 (05/26 0641) Resp:  [15] 15 (05/26 0641) BP: (158)/(106) 158/106 (05/26 0641) SpO2:  [96 %] 96 % (05/26 0641) Weight:  [70.3 kg] 70.3 kg (05/26 0641)   General:   Alert,  Well-developed, well-nourished, pleasant and cooperative in NAD Head:  Normocephalic and atraumatic. Eyes:  Sclera clear, no  icterus.   Conjunctiva pink. Ears:  Normal auditory acuity. Nose:  No deformity, discharge,  or lesions. Mouth:  No deformity or lesions, dentition normal. Neck:  Supple; no masses or thyromegaly. Lungs:  Clear throughout to auscultation.   No wheezes, crackles, or rhonchi. No acute distress. Heart:  Regular rate and rhythm; no murmurs, clicks, rubs,  or gallops. Abdomen:  Soft, nontender and nondistended. No masses, hepatosplenomegaly or hernias noted. Normal bowel sounds, without guarding, and without rebound.   Msk:  Symmetrical without gross deformities. Normal posture. Extremities:  Without clubbing or edema. Neurologic:  Alert and  oriented x4;  grossly normal neurologically. Skin:  Intact without significant lesions or rashes. Cervical Nodes:  No significant cervical adenopathy. Psych:  Alert and cooperative. Normal mood and affect.  Impression/Plan: Micheal Moses is here for a colonoscopy to be performed for surveillance purposes. Last colonoscopyApril 2019 with 4 polyps removed, diverticulosis in the rectosigmoid and sigmoid colon, external and internal hemorrhoids.  Pathology revealed sessile serrated polyp, hyperplastic polyp, tubular adenoma.   The risks of the procedure including infection, bleed, or perforation as well as benefits, limitations, alternatives and imponderables have been reviewed with the patient. Questions have been answered. All parties agreeable.

## 2022-02-04 NOTE — Anesthesia Preprocedure Evaluation (Addendum)
Anesthesia Evaluation  Patient identified by MRN, date of birth, ID band Patient awake    Reviewed: Allergy & Precautions, NPO status , Patient's Chart, lab work & pertinent test results  Airway Mallampati: II  TM Distance: >3 FB Neck ROM: Full    Dental  (+) Dental Advisory Given, Implants, Chipped   Pulmonary neg pulmonary ROS,    Pulmonary exam normal breath sounds clear to auscultation       Cardiovascular negative cardio ROS Normal cardiovascular exam Rhythm:Regular Rate:Normal     Neuro/Psych PSYCHIATRIC DISORDERS Anxiety negative neurological ROS     GI/Hepatic negative GI ROS, (+)     substance abuse (last use - year ago)  alcohol use and marijuana use,   Endo/Other  negative endocrine ROS  Renal/GU negative Renal ROS  negative genitourinary   Musculoskeletal  (+) Arthritis , Osteoarthritis,    Abdominal   Peds negative pediatric ROS (+)  Hematology negative hematology ROS (+)   Anesthesia Other Findings   Reproductive/Obstetrics negative OB ROS                            Anesthesia Physical Anesthesia Plan  ASA: 2  Anesthesia Plan: General   Post-op Pain Management: Minimal or no pain anticipated   Induction: Intravenous  PONV Risk Score and Plan:   Airway Management Planned: Nasal Cannula and Natural Airway  Additional Equipment:   Intra-op Plan:   Post-operative Plan:   Informed Consent: I have reviewed the patients History and Physical, chart, labs and discussed the procedure including the risks, benefits and alternatives for the proposed anesthesia with the patient or authorized representative who has indicated his/her understanding and acceptance.     Dental advisory given  Plan Discussed with: CRNA and Surgeon  Anesthesia Plan Comments:        Anesthesia Quick Evaluation

## 2022-02-10 ENCOUNTER — Encounter (HOSPITAL_COMMUNITY): Payer: Self-pay | Admitting: Internal Medicine

## 2022-06-20 IMAGING — MR MR MRA NECK W/O CM
2 series · 39 of 48 positions shown · non-contrast
Comparison: Carotid artery duplex 08/10/2021

CLINICAL DATA: Provided history: Dizziness. Additional history
provided by scanning technologist: Patient reports lightheadedness
for 5 months.

EXAM:
MRA NECK WITHOUT CONTRAST
TECHNIQUE: Angiographic images of the neck were acquired using MRA technique
without intravenous contrast. Carotid stenosis measurements (when
applicable) are obtained utilizing NASCET criteria, using the distal
internal carotid diameter as the denominator.

[Series 2: [id number] · axial · B · 0.6mm · 0.52mm/px · z∈[-37,+74]mm · 38 of 195 slices shown]
[im 1/195]
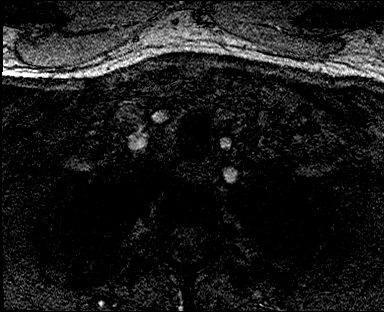
[im 5/195]
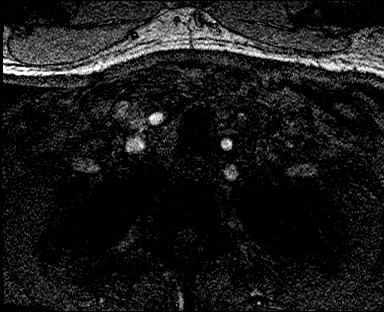
[im 9/195]
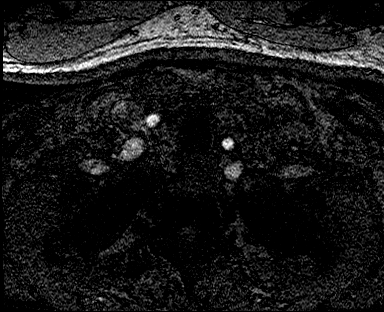
[im 13/195]
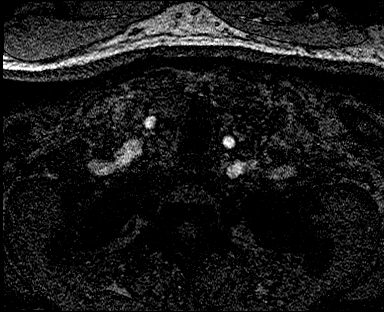
[im 17/195]
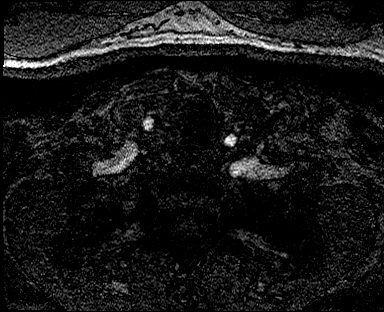
[im 22/195]
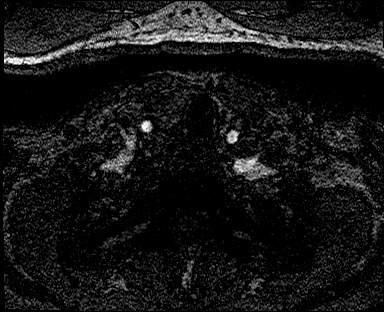
[im 26/195]
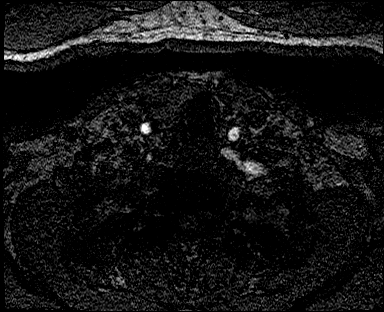
[im 30/195]
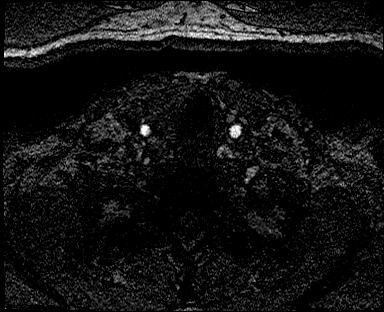
[im 34/195]
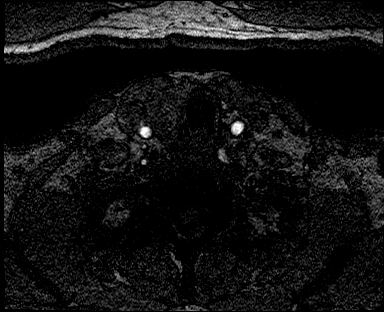
[im 38/195]
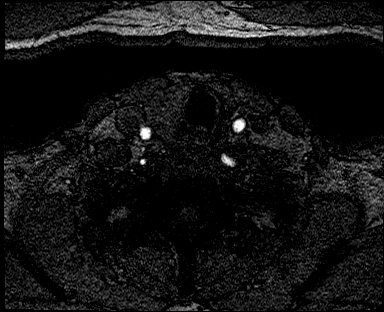
[im 43/195]
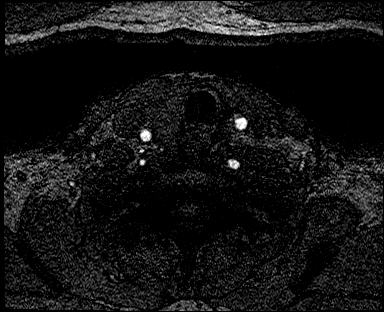
[im 47/195]
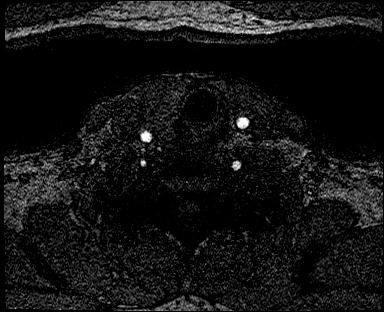
[im 51/195]
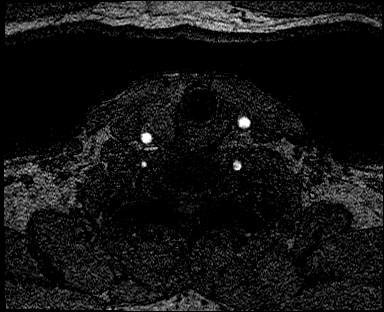
[im 55/195]
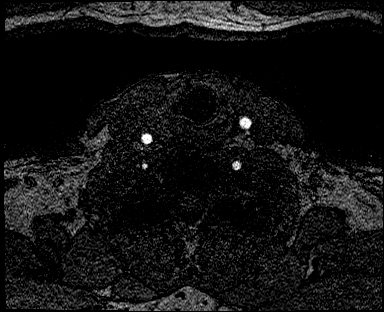
[im 60/195]
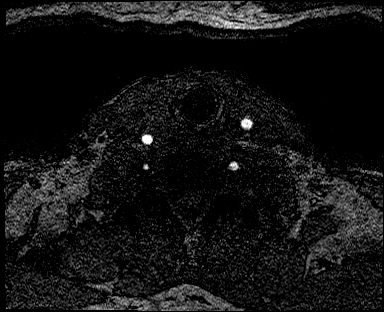
[im 64/195]
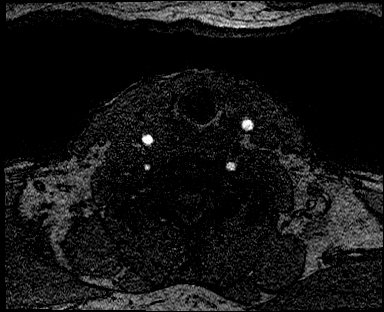
[im 68/195]
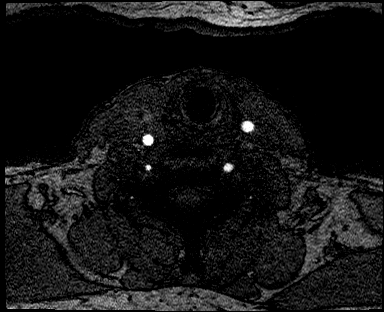
[im 72/195]
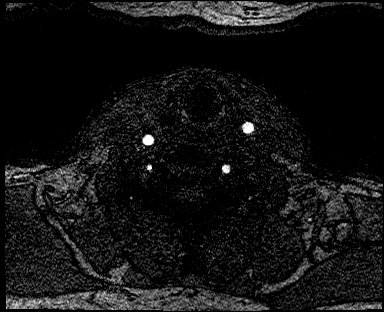
[im 76/195]
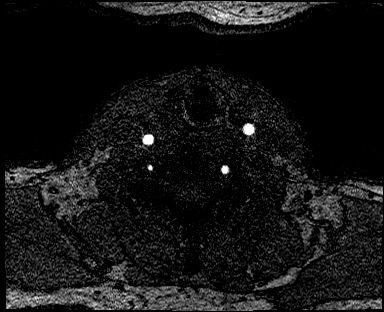
[im 81/195]
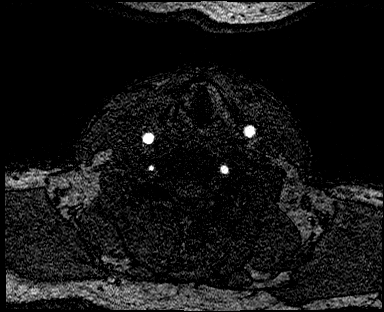
[im 85/195]
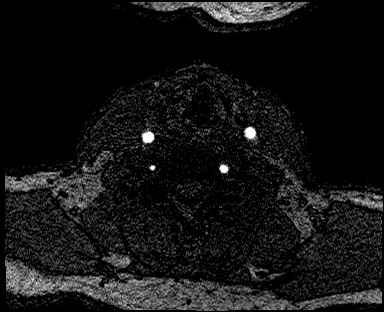
[im 89/195]
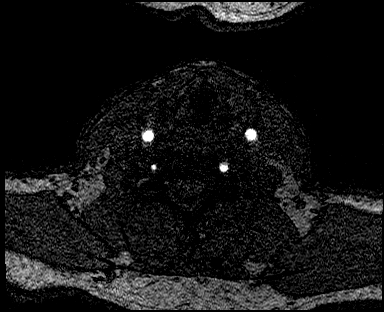
[im 93/195]
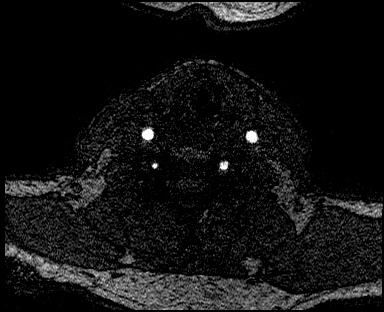
[im 98/195]
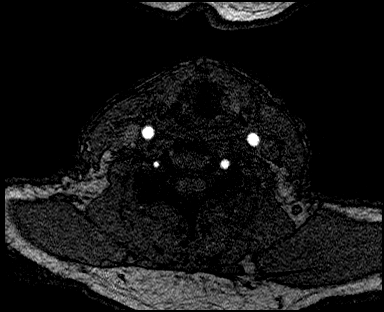
[im 102/195]
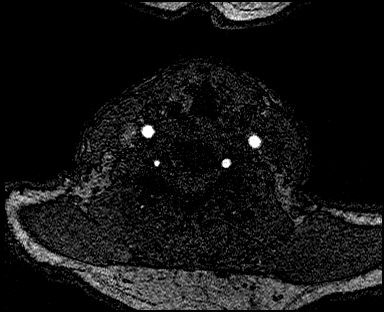
[im 106/195]
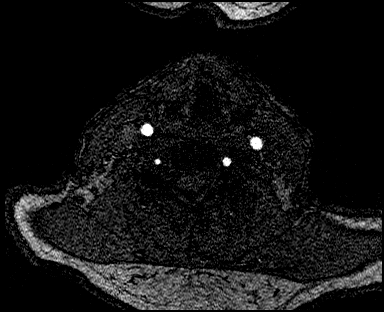
[im 110/195]
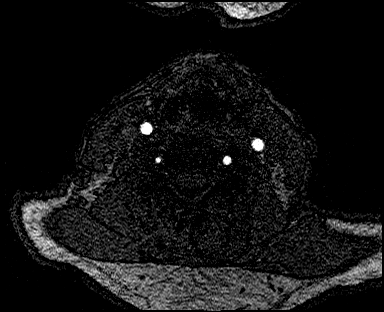
[im 114/195]
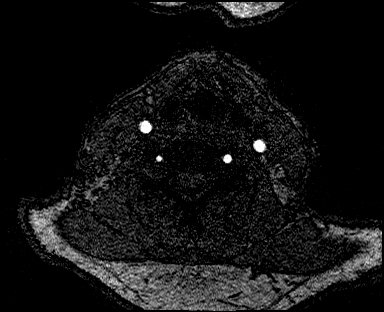
[im 119/195]
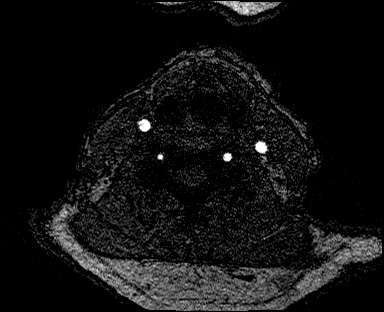
[im 123/195]
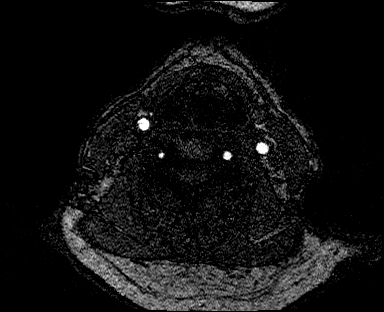
[im 127/195]
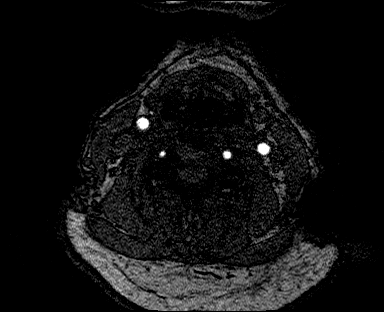
[im 131/195]
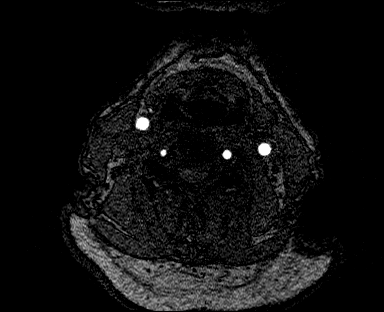
[im 135/195]
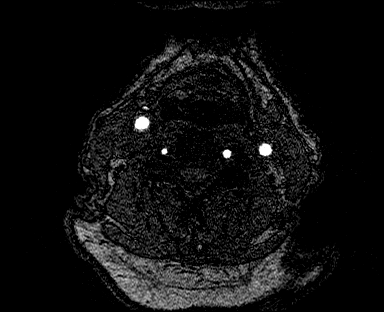
[im 140/195]
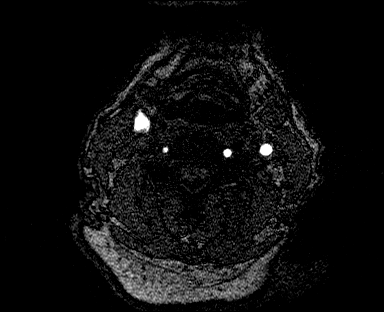
[im 144/195]
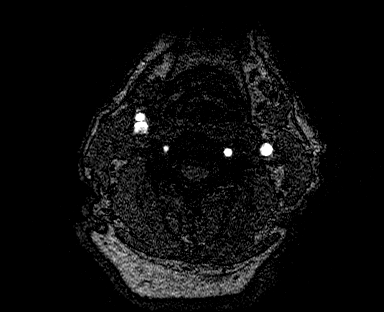
[im 161/195]
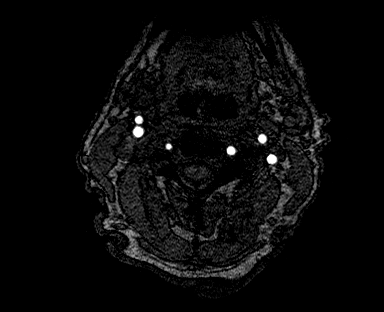
[im 165/195]
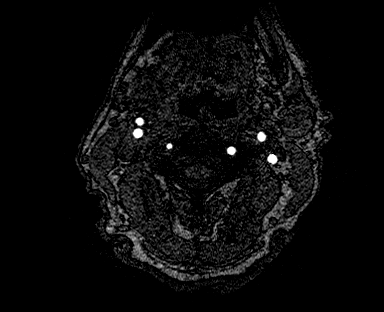
[im 186/195]
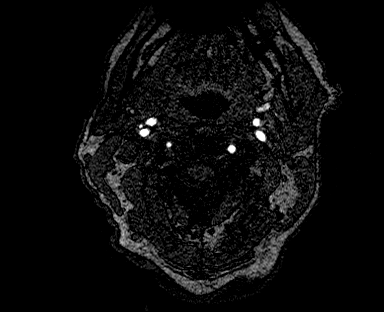

[Series 1015: rotate · coronal · B · 0.5mm · 0.30mm/px · 1 of 3 slices shown]
[im 1/3]
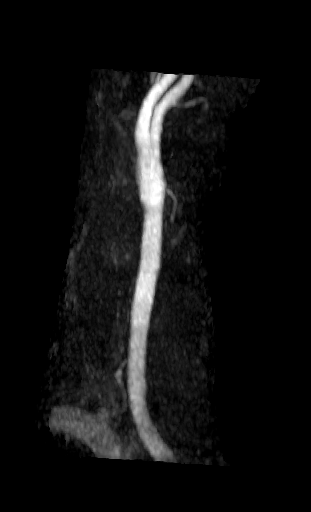

[39 of 48 positions shown; findings below may reference images not displayed]

FINDINGS: The common carotid artery origins are excluded from the field of
view. Additionally, the distal cervical internal carotid arteries
and distal cervical vertebral arteries are excluded from the field
of view.

The visualized common carotid and internal carotid arteries are
patent within the neck without appreciable stenosis. Suspected
minimal atherosclerotic plaque about both carotid bifurcations.

The visualized vertebral arteries are patent within the neck. The
left vertebral artery is dominant. Apparent moderate stenosis within
the proximal V1 right vertebral artery.
IMPRESSION: The common carotid artery origins are excluded from the field of
view. Additionally, the distal cervical internal carotid arteries
and distal cervical vertebral arteries are excluded from the field
of view.

The visualized common carotid and internal carotid arteries are
patent within the neck without appreciable stenosis. Suspected
minimal atherosclerotic plaque about both carotid bifurcations.

The visualized vertebral arteries are patent within the neck.
Apparent moderate stenosis within the proximal V1 segment of the
non-dominant right vertebral artery.

## 2024-02-07 ENCOUNTER — Encounter (HOSPITAL_COMMUNITY): Payer: Self-pay

## 2024-02-07 ENCOUNTER — Inpatient Hospital Stay (HOSPITAL_COMMUNITY)
Admission: EM | Admit: 2024-02-07 | Discharge: 2024-02-09 | DRG: 390 | Disposition: A | Discharge status: Home / Self Care | Attending: General Surgery | Admitting: General Surgery

## 2024-02-07 ENCOUNTER — Other Ambulatory Visit: Payer: Self-pay

## 2024-02-07 ENCOUNTER — Emergency Department (HOSPITAL_COMMUNITY)

## 2024-02-07 DIAGNOSIS — Z79899 Other long term (current) drug therapy: Secondary | ICD-10-CM

## 2024-02-07 DIAGNOSIS — R918 Other nonspecific abnormal finding of lung field: Secondary | ICD-10-CM | POA: Diagnosis present

## 2024-02-07 DIAGNOSIS — K56609 Unspecified intestinal obstruction, unspecified as to partial versus complete obstruction: Secondary | ICD-10-CM | POA: Diagnosis not present

## 2024-02-07 DIAGNOSIS — F419 Anxiety disorder, unspecified: Secondary | ICD-10-CM | POA: Diagnosis present

## 2024-02-07 DIAGNOSIS — G4700 Insomnia, unspecified: Secondary | ICD-10-CM | POA: Diagnosis present

## 2024-02-07 DIAGNOSIS — Z79891 Long term (current) use of opiate analgesic: Secondary | ICD-10-CM

## 2024-02-07 DIAGNOSIS — M1990 Unspecified osteoarthritis, unspecified site: Secondary | ICD-10-CM | POA: Diagnosis present

## 2024-02-07 DIAGNOSIS — Z91018 Allergy to other foods: Secondary | ICD-10-CM

## 2024-02-07 DIAGNOSIS — G8929 Other chronic pain: Secondary | ICD-10-CM | POA: Diagnosis present

## 2024-02-07 DIAGNOSIS — G894 Chronic pain syndrome: Secondary | ICD-10-CM | POA: Diagnosis not present

## 2024-02-07 DIAGNOSIS — R109 Unspecified abdominal pain: Secondary | ICD-10-CM | POA: Diagnosis not present

## 2024-02-07 LAB — URINALYSIS, ROUTINE W REFLEX MICROSCOPIC
Bilirubin Urine: NEGATIVE
Glucose, UA: NEGATIVE mg/dL
Hgb urine dipstick: NEGATIVE
Ketones, ur: NEGATIVE mg/dL
Leukocytes,Ua: NEGATIVE
Nitrite: NEGATIVE
Protein, ur: NEGATIVE mg/dL
Specific Gravity, Urine: 1.023 (ref 1.005–1.030)
pH: 5 (ref 5.0–8.0)

## 2024-02-07 LAB — CBC
HCT: 51.2 % (ref 39.0–52.0)
Hemoglobin: 17.9 g/dL — ABNORMAL HIGH (ref 13.0–17.0)
MCH: 31.9 pg (ref 26.0–34.0)
MCHC: 35 g/dL (ref 30.0–36.0)
MCV: 91.1 fL (ref 80.0–100.0)
Platelets: 220 10*3/uL (ref 150–400)
RBC: 5.62 MIL/uL (ref 4.22–5.81)
RDW: 12.2 % (ref 11.5–15.5)
WBC: 11.4 10*3/uL — ABNORMAL HIGH (ref 4.0–10.5)
nRBC: 0 % (ref 0.0–0.2)

## 2024-02-07 LAB — COMPREHENSIVE METABOLIC PANEL WITH GFR
ALT: 20 U/L (ref 0–44)
AST: 21 U/L (ref 15–41)
Albumin: 4.5 g/dL (ref 3.5–5.0)
Alkaline Phosphatase: 63 U/L (ref 38–126)
Anion gap: 11 (ref 5–15)
BUN: 17 mg/dL (ref 8–23)
CO2: 25 mmol/L (ref 22–32)
Calcium: 9.7 mg/dL (ref 8.9–10.3)
Chloride: 101 mmol/L (ref 98–111)
Creatinine, Ser: 1.11 mg/dL (ref 0.61–1.24)
GFR, Estimated: >60 mL/min (ref >60)
Glucose, Bld: 127 mg/dL — ABNORMAL HIGH (ref 70–99)
Potassium: 4.6 mmol/L (ref 3.5–5.1)
Sodium: 137 mmol/L (ref 135–145)
Total Bilirubin: 0.9 mg/dL (ref 0.0–1.2)
Total Protein: 8.3 g/dL — ABNORMAL HIGH (ref 6.5–8.1)

## 2024-02-07 LAB — LIPASE, BLOOD: Lipase: 28 U/L (ref 11–51)

## 2024-02-07 MED ORDER — ACETAMINOPHEN 325 MG PO TABS: 650.0000 mg | ORAL_TABLET | Freq: Four times a day (QID) | ORAL | Status: DC | PRN

## 2024-02-07 MED ORDER — LORAZEPAM 2 MG/ML IJ SOLN: 0.5000 mg | Freq: Every day | INTRAMUSCULAR | Status: DC

## 2024-02-07 MED ORDER — ONDANSETRON HCL 4 MG/2ML IJ SOLN: 4.0000 mg | Freq: Four times a day (QID) | INTRAMUSCULAR | Status: DC | PRN

## 2024-02-07 MED ORDER — ONDANSETRON 4 MG PO TBDP
4.0000 mg | ORAL_TABLET | Freq: Once | ORAL | Status: AC | PRN
  Administered 2024-02-07: 4 mg via ORAL
  Filled 2024-02-07: qty 1

## 2024-02-07 MED ORDER — ONDANSETRON HCL 4 MG/2ML IJ SOLN
4.0000 mg | Freq: Once | INTRAMUSCULAR | Status: AC
  Administered 2024-02-07: 4 mg via INTRAVENOUS
  Filled 2024-02-07: qty 2

## 2024-02-07 MED ORDER — HYDROMORPHONE HCL 1 MG/ML IJ SOLN: 0.5000 mg | INTRAMUSCULAR | Status: DC | PRN

## 2024-02-07 MED ORDER — HYDROMORPHONE HCL 1 MG/ML IJ SOLN
1.0000 mg | Freq: Once | INTRAMUSCULAR | Status: AC
  Administered 2024-02-07: 1 mg via INTRAVENOUS
  Filled 2024-02-07: qty 1

## 2024-02-07 MED ORDER — LORAZEPAM 2 MG/ML IJ SOLN: 0.5000 mg | Freq: Every evening | INTRAMUSCULAR | Status: DC | PRN

## 2024-02-07 MED ORDER — ACETAMINOPHEN 650 MG RE SUPP: 650.0000 mg | Freq: Four times a day (QID) | RECTAL | Status: DC | PRN

## 2024-02-07 MED ORDER — LORAZEPAM 2 MG/ML IJ SOLN
0.5000 mg | Freq: Every evening | INTRAMUSCULAR | Status: DC | PRN
  Administered 2024-02-07 – 2024-02-08 (×2): 0.5 mg via INTRAVENOUS
  Filled 2024-02-07 (×2): qty 1

## 2024-02-07 MED ORDER — ONDANSETRON HCL 4 MG PO TABS: 4.0000 mg | ORAL_TABLET | Freq: Four times a day (QID) | ORAL | Status: DC | PRN

## 2024-02-07 MED ORDER — IOHEXOL 300 MG/ML  SOLN
100.0000 mL | Freq: Once | INTRAMUSCULAR | Status: AC | PRN
  Administered 2024-02-07: 100 mL via INTRAVENOUS

## 2024-02-07 MED ORDER — DEXTROSE IN LACTATED RINGERS 5 % IV SOLN: INTRAVENOUS | Status: AC

## 2024-02-07 MED ORDER — PIPERACILLIN-TAZOBACTAM 3.375 G IVPB 30 MIN
3.3750 g | Freq: Once | INTRAVENOUS | Status: AC
  Administered 2024-02-07: 3.375 g via INTRAVENOUS
  Filled 2024-02-07: qty 50

## 2024-02-07 NOTE — ED Triage Notes (Signed)
 Pt arrived via POV from home c/o abdominal pain that began this morning. Pt reports emesis after drinking fluids this morning. Pt reports last BM was yesterday. Pt denies Hx of GERD.

## 2024-02-07 NOTE — Assessment & Plan Note (Signed)
 Chronic back, knee pains, shoulder pains. Hold home Percocet, continue with IV Dilaudid  for abdominal pain.

## 2024-02-07 NOTE — H&P (Signed)
 History and Physical    Micheal Moses:[id number] DOB: [birth year] DOA: 02/07/2024  PCP: Chandler Combs, MD   Patient coming from: Home  I have personally briefly reviewed patient's old medical records in [facility]  Chief Complaint: Abdominal pain.  HPI: Micheal Moses is a 73 y.o. male with medical history significant for chronic pain.  Patient presented to the ED with complaints of sudden onset of abdominal pain that started this morning.  Reports vomiting also.  Last bowel movement was yesterday.  Reports abdominal bloating also.  Denies prior abdominal surgeries. Drinks 1-2 beers daily, reports he is not dependent on it.  No history of shakes or withdrawal.  Spouse is at bedside.  ED Course: Temperature 97.5.  Heart rate 55-64.  Respirate rate 18.  Blood pressure systolic 131-176.  O2 sats greater than 96% on room air.  Lipase 28, WBC 11.4. CT abdomen and pelvis with contrast- Mildly dilated loop of small bowel in the central abdomen with some small bowel stool appearance. There is an abrupt transition along the anterior mid abdomen. There is also a loop of small bowel extending from this area of caliber change with wall thickening. Scattered mesenteric edema and some trace free fluid. Please correlate for developing obstruction. Based on the overall appearance this could be an early high-grade obstruction versus a partial.  Patient started prophylactically on Zosyn.  Review of Systems: As per HPI all other systems reviewed and negative.  Past Medical History:  Diagnosis Date   Anxiety    Arthritis    rt hand, knees    Past Surgical History:  Procedure Laterality Date   CARPOMETACARPAL (CMC) FUSION OF THUMB  12/12/2011   rt thumb   CARPOMETACARPEL SUSPENSION PLASTY  08/22/2012   Procedure: CARPOMETACARPEL (CMC) SUSPENSION PLASTY;  Surgeon: Hedy Living, MD;  Location: [facility];  Service: Orthopedics;  Laterality: Right;  REDO RIGHT THUMB CMC  ARTHROPLASTY with APL Transfer/Osteophyte Removal/Right Wrist Superfacial Radial Nerve Neuroplasty    COLONOSCOPY WITH PROPOFOL  N/A 01/09/2018   Surgeon: Alyce Jubilee, MD;  polyps removed, diverticulosis in the rectosigmoid and sigmoid colon, external and internal hemorrhoids.  Pathology revealed sessile serrated polyp, hyperplastic polyp, tubular adenoma.  Recommended repeat colonoscopy in 3 years.   COLONOSCOPY WITH PROPOFOL  N/A 02/04/2022   Procedure: COLONOSCOPY WITH PROPOFOL ;  Surgeon: Vinetta Greening, DO;  [facility];  Service: Endoscopy;  Laterality: N/A;  9:00am   POLYPECTOMY  01/09/2018   Procedure: POLYPECTOMY;  Surgeon: Alyce Jubilee, MD;  Location: [facility];  Service: Endoscopy;;  Transverse colon x1, splenic flexure x1, sigmoid colon x2     reports that he has never smoked. He has never used smokeless tobacco. He reports current alcohol use. He reports current drug use. Drug: Marijuana.  Allergies  Allergen Reactions   Mushroom Nausea And Vomiting    Family History  Problem Relation Age of Onset   Colon cancer Neg Hx     Prior to Admission medications   Medication Sig Start Date End Date Taking? Authorizing Provider  ALPRAZolam (XANAX) 1 MG tablet Take 1 mg by mouth at bedtime.   Yes [provider]  oxyCODONE -acetaminophen  (ROXICET) 5-325 MG per tablet Take 1 tablet by mouth every 4 (four) hours as needed for pain. Patient taking differently: Take 1 tablet by mouth every 6 (six) hours as needed for severe pain (pain score 7-10). 08/22/12  Yes Florida Hurter, MD    Physical Exam: Vitals:  02/07/24 1147 02/07/24 1148 02/07/24 1445 02/07/24 1618  BP: (!) 176/94  (!) 146/91   Pulse: 64  (!) 55   Resp: 18  18   Temp: (!) 97.5 F (36.4 C)   97.6 F (36.4 C)  TempSrc: Oral   Oral  SpO2: 100%  96%   Weight:  70.3 kg    Height:  5\' 7"  (1.702 m)      Constitutional: NAD, calm, comfortable Vitals:   02/07/24 1147 02/07/24 1148  02/07/24 1445 02/07/24 1618  BP: (!) 176/94  (!) 146/91   Pulse: 64  (!) 55   Resp: 18  18   Temp: (!) 97.5 F (36.4 C)   97.6 F (36.4 C)  TempSrc: Oral   Oral  SpO2: 100%  96%   Weight:  70.3 kg    Height:  5\' 7"  (1.702 m)     Eyes: PERRL, lids and conjunctivae normal ENMT: Mucous membranes are moist.  Neck: normal, supple, no masses, no thyromegaly Respiratory: clear to auscultation bilaterally, no wheezing, no crackles. Normal respiratory effort. No accessory muscle use.  Cardiovascular: Regular rate and rhythm, no murmurs / rubs / gallops. No extremity edema.  Abdomen: Mild diffuse tenderness worse on the left, not distended, no masses palpated. No hepatosplenomegaly.  Musculoskeletal: no clubbing / cyanosis. No joint deformity upper and lower extremities.  Skin: no rashes, lesions, ulcers. No induration Neurologic: No facial asymmetry, moving extremities spontaneously, speech fluent.Aaron Aas  Psychiatric: Normal judgment and insight. Alert and oriented x 3. Normal mood.   Labs on Admission: I have personally reviewed following labs and imaging studies  CBC: Recent Labs  Lab 02/07/24 1204  WBC 11.4*  HGB 17.9*  HCT 51.2  MCV 91.1  PLT 220   Basic Metabolic Panel: Recent Labs  Lab 02/07/24 1204  NA 137  K 4.6  CL 101  CO2 25  GLUCOSE 127*  BUN 17  CREATININE 1.11  CALCIUM 9.7   GFR: Estimated Creatinine Clearance: 57.9 mL/min (by C-G formula based on SCr of 1.11 mg/dL). Liver Function Tests: Recent Labs  Lab 02/07/24 1204  AST 21  ALT 20  ALKPHOS 63  BILITOT 0.9  PROT 8.3*  ALBUMIN 4.5   Recent Labs  Lab 02/07/24 1204  LIPASE 28   Urine analysis:    Component Value Date/Time   COLORURINE YELLOW 02/07/2024 1446   APPEARANCEUR HAZY (A) 02/07/2024 1446   LABSPEC 1.023 02/07/2024 1446   PHURINE 5.0 02/07/2024 1446   GLUCOSEU NEGATIVE 02/07/2024 1446   HGBUR NEGATIVE 02/07/2024 1446   BILIRUBINUR NEGATIVE 02/07/2024 1446   KETONESUR NEGATIVE  02/07/2024 1446   PROTEINUR NEGATIVE 02/07/2024 1446   NITRITE NEGATIVE 02/07/2024 1446   LEUKOCYTESUR NEGATIVE 02/07/2024 1446    Radiological Exams on Admission: CT ABDOMEN PELVIS W CONTRAST Result Date: 02/07/2024 CLINICAL DATA:  Abdominal pain.  Nausea vomiting EXAM: CT ABDOMEN AND PELVIS WITH CONTRAST TECHNIQUE: Multidetector CT imaging of the abdomen and pelvis was performed using the standard protocol following bolus administration of intravenous contrast. RADIATION DOSE REDUCTION: This exam was performed according to the departmental dose-optimization program which includes automated exposure control, adjustment of the mA and/or kV according to patient size and/or use of iterative reconstruction technique. CONTRAST:  100mL OMNIPAQUE IOHEXOL 300 MG/ML  SOLN COMPARISON:  None Available. FINDINGS: Lower chest: There is some linear opacity along bases likely scar or atelectasis. No consolidation. There is some calcified lung nodules identified the right lung base greater than left, likely related to old granulomatous  disease. There is a 6 mm nodule which is noncalcified just above the right hemidiaphragm on image 14 of series 3. Small hiatal hernia. Hepatobiliary: No space-occupying liver lesion. Patent portal vein. Gallbladder is present. Pancreas: Global atrophy of the pancreas. Spleen: Punctate calcification in the spleen consistent with old granulomatous disease. Adrenals/Urinary Tract: The adrenal glands are preserved. No enhancing renal mass or collecting system dilatation. The ureters have a normal course and caliber down to the bladder preserved contour to the urinary bladder. Stomach/Bowel: Large bowel has a normal course and caliber. Sigmoid colon diverticula. Normal appendix. Stomach is nondilated. Duodenum is normal course and caliber. Long segment of fluid-filled dilated small bowel identified in the central abdomen. Few air-fluid levels and some areas of small bowel stool appearance.  Diameter these loops approach up to 3.5 cm. No pneumatosis. There is a focal transition point seen in the anterior right mid abdomen best seen on coronal series 4, image 43. This corresponds to axial image 48. No mass in this location. Loop of bowel in this location does have some significant wall thickening and edema. There is scattered mesenteric stranding as well as some scattered mild free fluid particularly in the pelvis and adjacent to the liver. No free air identified. Vascular/Lymphatic: Aortic atherosclerosis. No enlarged abdominal or pelvic lymph nodes. Reproductive: Prostate is unremarkable. Other: Small fat containing inguinal hernias, right greater left. Small umbilical hernia. Musculoskeletal: Moderate degenerative changes along the spine. There is trace anterolisthesis at L3-4 and L5-S1. Retrolisthesis of L4-5. 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 No changes along the pelvis. Critical Value/emergent results were called by telephone at the time of interpretation on 02/07/2024 at 3:34 pm to provider Shyrl Doyne , who verbally acknowledged these results. IMPRESSION: Mildly dilated loop of small bowel in the central abdomen with some small bowel stool appearance. There is an abrupt transition along the anterior mid abdomen. There is also a loop of small bowel extending from this area of caliber change with wall thickening. Scattered mesenteric edema and some trace free fluid. Please correlate for developing obstruction. Based on the overall appearance this could be an early high-grade obstruction versus a partial. Please correlate clinical presentation. Sigmoid colon diverticula.  Normal appendix small hiatal hernia. Multiple calcified lung nodules. There is 1 right lung base juxtapleural noncalcified 6 mm lung nodule. Non-contrast chest CT at 6-12 months is recommended. If the nodule is stable at time of repeat CT, then future CT at 18-24 months (from today's scan) is considered  optional for low-risk patients, but is recommended for high-risk patients. This recommendation follows the consensus statement: Guidelines for Management of Incidental Pulmonary Nodules Detected on CT Images: From the Fleischner Society 2017; Radiology 2017; 284:228-243. Electronically Signed   By: Adrianna Horde M.D.   On: 02/07/2024 16:16   EKG: Independently reviewed.   Assessment/Plan Principal Problem:   SBO (small bowel obstruction) (HCC) Active Problems:   Chronic pain   Assessment and Plan: * SBO (small bowel obstruction) (HCC) With abdominal pain and emesis.  No history of abdominal surgeries.  Last bowel movement was yesterday.  CT abdomen and pelvis with contrast shows small bowel obstruction scattered mesenteric edema and some trace free fluid.  Concerning for early high-grade obstruction versus partial. - EDP talked with Dr. Larrie Po - Abdomen is not distended, patient not vomiting, NG tube deferred for now -IV Dilaudid  0.5 every 4 hourly as needed - D5 LR 75cc/hr x 1 day -Remain n.p.o.  Chronic pain Chronic back, knee pains,  shoulder pains. Hold home Percocet, continue with IV Dilaudid  for abdominal pain.  Insomnia-patient requesting his nightly 1 mg Xanax - 0.5 Xanax nightly as needed while n.p.o.   DVT prophylaxis: SCDS-in general surgery evaluation Code Status: Full code Family Communication: Spouse at bedside Disposition Plan: ~ 2 days Consults called: Gen Surg Admission status: Observation, MedSurg   Author: Pati Bonine, MD 02/07/2024 8:58 PM  For on call review [email or web address].

## 2024-02-07 NOTE — Assessment & Plan Note (Signed)
 With abdominal pain and emesis.  No history of abdominal surgeries.  Last bowel movement was yesterday.  CT abdomen and pelvis with contrast shows small bowel obstruction scattered mesenteric edema and some trace free fluid.  Concerning for early high-grade obstruction versus partial. - EDP talked with Dr. Larrie Po - Abdomen is not distended, patient not vomiting, NG tube deferred for now -IV Dilaudid  0.5 every 4 hourly as needed - D5 LR 75cc/hr x 1 day -Remain n.p.o.

## 2024-02-07 NOTE — ED Notes (Signed)
Pt aware we need urine specimen.  

## 2024-02-07 NOTE — ED Provider Notes (Signed)
 [place] EMERGENCY DEPARTMENT AT [facility] Provider Note   CSN: [id number] Arrival date & time: 02/07/24  1110     History  Chief Complaint  Patient presents with   Abdominal Pain    Micheal Moses is a 73 y.o. male.  73 year old male history of chronic pain on Percocet who presents emergency room with abdominal pain nausea and vomiting.  Patient reports that he is awoken today by severe abdominal pain.  Feels like bloating that comes in waves and is severe.  Also has had multiple episodes of nonbloody nonbilious emesis.  Decreased flatus today as well.  No abdominal surgeries.  No marijuana use.  Did drink alcohol over the weekend.       Home Medications Prior to Admission medications   Medication Sig Start Date End Date Taking? Authorizing Provider  ALPRAZolam (XANAX) 1 MG tablet Take 1 mg by mouth at bedtime.   Yes [provider]  oxyCODONE -acetaminophen  (ROXICET) 5-325 MG per tablet Take 1 tablet by mouth every 4 (four) hours as needed for pain. Patient taking differently: Take 1 tablet by mouth every 6 (six) hours as needed for severe pain (pain score 7-10). 08/22/12  Yes Florida Hurter, MD      Allergies    Mushroom    Review of Systems   Review of Systems  Physical Exam Updated Vital Signs BP 131/87 (BP Location: Left Arm)   Pulse 60   Temp (!) 97.5 F (36.4 C) (Oral)   Resp 18   Ht 5\' 7"  (1.702 m)   Wt 70.3 kg   SpO2 97%   BMI 24.27 kg/m  Physical Exam Vitals and nursing note reviewed.  Constitutional:      General: He is not in acute distress.    Appearance: He is well-developed.  Pulmonary:     Effort: Pulmonary effort is normal. No respiratory distress.  Abdominal:     General: There is no distension.     Palpations: Abdomen is soft. There is no mass.     Tenderness: There is abdominal tenderness (Diffuse but worse in the left mid abdomen.). There is guarding (Voluntary diffuse).  Musculoskeletal:     Cervical back:  Normal range of motion and neck supple.  Skin:    General: Skin is warm and dry.  Neurological:     Mental Status: He is alert. Mental status is at baseline.  Psychiatric:        Mood and Affect: Mood normal.        Behavior: Behavior normal.     ED Results / Procedures / Treatments   Labs (all labs ordered are listed, but only abnormal results are displayed) Labs Reviewed  COMPREHENSIVE METABOLIC PANEL WITH GFR - Abnormal; Notable for the following components:      Result Value   Glucose, Bld 127 (*)    Total Protein 8.3 (*)    All other components within normal limits  CBC - Abnormal; Notable for the following components:   WBC 11.4 (*)    Hemoglobin 17.9 (*)    All other components within normal limits  URINALYSIS, ROUTINE W REFLEX MICROSCOPIC - Abnormal; Notable for the following components:   APPearance HAZY (*)    All other components within normal limits  LIPASE, BLOOD  HIV ANTIBODY (ROUTINE TESTING W REFLEX)  BASIC METABOLIC PANEL WITH GFR  CBC    EKG EKG Interpretation Date/Time:  Wednesday Feb 07 2024 14:12:06 EDT Ventricular Rate:  58 PR Interval:  120 QRS  Duration:  114 QT Interval:  426 QTC Calculation: 419 R Axis:   54  Text Interpretation: Sinus rhythm Borderline intraventricular conduction delay Confirmed by Shyrl Doyne [phone]) on 02/07/2024 2:14:40 PM  Radiology CT ABDOMEN PELVIS W CONTRAST Result Date: 02/07/2024 CLINICAL DATA:  Abdominal pain.  Nausea vomiting EXAM: CT ABDOMEN AND PELVIS WITH CONTRAST TECHNIQUE: Multidetector CT imaging of the abdomen and pelvis was performed using the standard protocol following bolus administration of intravenous contrast. RADIATION DOSE REDUCTION: This exam was performed according to the departmental dose-optimization program which includes automated exposure control, adjustment of the mA and/or kV according to patient size and/or use of iterative reconstruction technique. CONTRAST:  OMNIPAQUE  IOHEXOL  300  MG/ML  SOLN COMPARISON:  None Available. FINDINGS: Lower chest: There is some linear opacity along bases likely scar or atelectasis. No consolidation. There is some calcified lung nodules identified the right lung base greater than left, likely related to old granulomatous disease. There is a 6 mm nodule which is noncalcified just above the right hemidiaphragm on image 14 of series 3. Small hiatal hernia. Hepatobiliary: No space-occupying liver lesion. Patent portal vein. Gallbladder is present. Pancreas: Global atrophy of the pancreas. Spleen: Punctate calcification in the spleen consistent with old granulomatous disease. Adrenals/Urinary Tract: The adrenal glands are preserved. No enhancing renal mass or collecting system dilatation. The ureters have a normal course and caliber down to the bladder preserved contour to the urinary bladder. Stomach/Bowel: Large bowel has a normal course and caliber. Sigmoid colon diverticula. Normal appendix. Stomach is nondilated. Duodenum is normal course and caliber. Long segment of fluid-filled dilated small bowel identified in the central abdomen. Few air-fluid levels and some areas of small bowel stool appearance. Diameter these loops approach up to 3.5 cm. No pneumatosis. There is a focal transition point seen in the anterior right mid abdomen best seen on coronal series 4, image 43. This corresponds to axial image 48. No mass in this location. Loop of bowel in this location does have some significant wall thickening and edema. There is scattered mesenteric stranding as well as some scattered mild free fluid particularly in the pelvis and adjacent to the liver. No free air identified. Vascular/Lymphatic: Aortic atherosclerosis. No enlarged abdominal or pelvic lymph nodes. Reproductive: Prostate is unremarkable. Other: Small fat containing inguinal hernias, right greater left. Small umbilical hernia. Musculoskeletal: Moderate degenerative changes along the spine. There is  trace anterolisthesis at L3-4 and L5-S1. Retrolisthesis of L4-5. 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 No changes along the pelvis. Critical Value/emergent results were called by telephone at the time of interpretation on 02/07/2024 at 3:34 pm to provider Shyrl Doyne , who verbally acknowledged these results. IMPRESSION: Mildly dilated loop of small bowel in the central abdomen with some small bowel stool appearance. There is an abrupt transition along the anterior mid abdomen. There is also a loop of small bowel extending from this area of caliber change with wall thickening. Scattered mesenteric edema and some trace free fluid. Please correlate for developing obstruction. Based on the overall appearance this could be an early high-grade obstruction versus a partial. Please correlate clinical presentation. Sigmoid colon diverticula.  Normal appendix small hiatal hernia. Multiple calcified lung nodules. There is 1 right lung base juxtapleural noncalcified 6 mm lung nodule. Non-contrast chest CT at 6-12 months is recommended. If the nodule is stable at time of repeat CT, then future CT at 18-24 months (from today's scan) is considered optional for low-risk patients, but is recommended for high-risk  patients. This recommendation follows the consensus statement: Guidelines for Management of Incidental Pulmonary Nodules Detected on CT Images: From the Fleischner Society 2017; Radiology 2017; 284:228-243. Electronically Signed   By: Adrianna Horde M.D.   On: 02/07/2024 16:16    Procedures Procedures    Medications Ordered in ED Medications  dextrose 5 % in lactated ringers  infusion ( Intravenous New Bag/Given 02/07/24 1900)  HYDROmorphone  (DILAUDID ) injection 0.5 mg (has no administration in time range)  acetaminophen  (TYLENOL ) tablet 650 mg (has no administration in time range)    Or  acetaminophen  (TYLENOL ) suppository 650 mg (has no administration in time range)  ondansetron  (ZOFRAN )  tablet 4 mg (has no administration in time range)    Or  ondansetron  (ZOFRAN ) injection 4 mg (has no administration in time range)  LORazepam (ATIVAN) injection 0.5 mg (0.5 mg Intravenous Given 02/07/24 2319)  ondansetron  (ZOFRAN -ODT) disintegrating tablet 4 mg (4 mg Oral Given 02/07/24 1151)  ondansetron  (ZOFRAN ) injection 4 mg (4 mg Intravenous Given 02/07/24 1401)  HYDROmorphone  (DILAUDID ) injection 1 mg (1 mg Intravenous Given 02/07/24 1401)  iohexol (OMNIPAQUE) 300 MG/ML solution 100 mL (100 mLs Intravenous Contrast Given 02/07/24 1521)  piperacillin-tazobactam (ZOSYN) IVPB 3.375 g (0 g Intravenous Stopped 02/07/24 1656)  HYDROmorphone  (DILAUDID ) injection 1 mg (1 mg Intravenous Given 02/07/24 1618)    ED Course/ Medical Decision Making/ A&P Clinical Course as of 02/07/24 2346  Wed Feb 07, 2024  1545 Discussed with Dr Larrie Po who feels that we can hold off on NG tube at this time.  [RP]  1556 Dr Quintella Buck from hospitalist to admit. [RP]    Clinical Course User Index [RP] Ninetta Basket, MD                                 Medical Decision Making Amount and/or Complexity of Data Reviewed Labs: ordered. Radiology: ordered.  Risk Prescription drug management. Decision regarding hospitalization.   CADYN FANN is a 73 y.o. male with comorbidities that complicate the patient evaluation including chronic pain on Percocet he presents emergency department with abdominal pain and nausea and vomiting with decreased flatus  Initial Ddx:  SBO, ileus, gastroenteritis, intraabdominal abscess/infection  MDM:  With patient's presentation I am concerned about small bowel obstruction.  With their nausea and vomiting and pain we will give them pain and nausea medication at this time.  Will get a CT scan to evaluate for SBO or any other intra-abdominal infection.  Will also assess for ileus.  If negative will consider other etiologies such as gastroenteritis.  Plan:  Labs Pain  medication Nausea medication CT abdomen pelvis with IV contrast  ED Summary/Re-evaluation:  CT obtained which does show evidence of small bowel obstruction.  Given his voluntary guarding and free fluid on the CT did order him some Zosyn.  Discussed with general surgery who felt that the fluid was likely reactionary not due to perforation or true peritonitis.  They we will see the patient in the morning.  They felt that we could hold off on NG tube at this time.  Admitted to hospitalist for further management.  This patient presents to the ED for concern of complaints listed in HPI, this involves an extensive number of treatment options, and is a complaint that carries with it a high risk of complications and morbidity. Disposition including potential need for admission considered.   Dispo: Admit to Floor  Additional history obtained from spouse Records  reviewed Outpatient Clinic Notes The following labs were independently interpreted: Chemistry and show no acute abnormality I independently reviewed the following imaging with scope of interpretation limited to determining acute life threatening conditions related to emergency care: CT Abdomen/Pelvis and agree with the radiologist interpretation with the following exceptions: none I personally reviewed and interpreted cardiac monitoring: normal sinus rhythm  I personally reviewed and interpreted the pt's EKG: see above for interpretation  I have reviewed the patients home medications and made adjustments as needed Consults: General Surgery and Hospitalist Social Determinants of health:  Geriatric   Final Clinical Impression(s) / ED Diagnoses Final diagnoses:  SBO (small bowel obstruction) (HCC)    Rx / DC Orders ED Discharge Orders     None         Ninetta Basket, MD 02/07/24 2346

## 2024-02-08 DIAGNOSIS — Z91018 Allergy to other foods: Secondary | ICD-10-CM | POA: Diagnosis not present

## 2024-02-08 DIAGNOSIS — K56609 Unspecified intestinal obstruction, unspecified as to partial versus complete obstruction: Secondary | ICD-10-CM | POA: Diagnosis present

## 2024-02-08 DIAGNOSIS — Z79899 Other long term (current) drug therapy: Secondary | ICD-10-CM | POA: Diagnosis not present

## 2024-02-08 DIAGNOSIS — R918 Other nonspecific abnormal finding of lung field: Secondary | ICD-10-CM | POA: Diagnosis present

## 2024-02-08 DIAGNOSIS — G4700 Insomnia, unspecified: Secondary | ICD-10-CM | POA: Diagnosis present

## 2024-02-08 DIAGNOSIS — F419 Anxiety disorder, unspecified: Secondary | ICD-10-CM | POA: Diagnosis present

## 2024-02-08 DIAGNOSIS — Z79891 Long term (current) use of opiate analgesic: Secondary | ICD-10-CM | POA: Diagnosis not present

## 2024-02-08 DIAGNOSIS — G8929 Other chronic pain: Secondary | ICD-10-CM | POA: Diagnosis present

## 2024-02-08 DIAGNOSIS — R109 Unspecified abdominal pain: Secondary | ICD-10-CM | POA: Diagnosis present

## 2024-02-08 DIAGNOSIS — M1990 Unspecified osteoarthritis, unspecified site: Secondary | ICD-10-CM | POA: Diagnosis present

## 2024-02-08 LAB — CBC
HCT: 42.7 % (ref 39.0–52.0)
Hemoglobin: 15 g/dL (ref 13.0–17.0)
MCH: 32.2 pg (ref 26.0–34.0)
MCHC: 35.1 g/dL (ref 30.0–36.0)
MCV: 91.6 fL (ref 80.0–100.0)
Platelets: 204 10*3/uL (ref 150–400)
RBC: 4.66 MIL/uL (ref 4.22–5.81)
RDW: 12.3 % (ref 11.5–15.5)
WBC: 9.4 10*3/uL (ref 4.0–10.5)
nRBC: 0 % (ref 0.0–0.2)

## 2024-02-08 LAB — BASIC METABOLIC PANEL WITH GFR
Anion gap: 6 (ref 5–15)
BUN: 15 mg/dL (ref 8–23)
CO2: 26 mmol/L (ref 22–32)
Calcium: 8.7 mg/dL — ABNORMAL LOW (ref 8.9–10.3)
Chloride: 106 mmol/L (ref 98–111)
Creatinine, Ser: 1.01 mg/dL (ref 0.61–1.24)
GFR, Estimated: >60 mL/min (ref >60)
Glucose, Bld: 115 mg/dL — ABNORMAL HIGH (ref 70–99)
Potassium: 3.8 mmol/L (ref 3.5–5.1)
Sodium: 138 mmol/L (ref 135–145)

## 2024-02-08 LAB — HIV ANTIBODY (ROUTINE TESTING W REFLEX): HIV Screen 4th Generation wRfx: NONREACTIVE

## 2024-02-08 MED ORDER — MAGNESIUM HYDROXIDE 400 MG/5ML PO SUSP
30.0000 mL | Freq: Every day | ORAL | Status: DC
  Administered 2024-02-08 – 2024-02-09 (×2): 30 mL via ORAL
  Filled 2024-02-08 (×2): qty 30

## 2024-02-08 MED ORDER — POLYETHYLENE GLYCOL 3350 17 G PO PACK
17.0000 g | PACK | Freq: Every day | ORAL | Status: DC
  Administered 2024-02-08 – 2024-02-09 (×2): 17 g via ORAL
  Filled 2024-02-08 (×2): qty 1

## 2024-02-08 NOTE — Progress Notes (Signed)
 Mobility Specialist Progress Note:    02/08/24 1030  Mobility  Activity Ambulated independently in hallway  Level of Assistance Independent  Assistive Device None  Distance Ambulated (ft) 300 ft  Range of Motion/Exercises Active;All extremities  Activity Response Tolerated well  Mobility Referral Yes  Mobility visit 1 Mobility  Mobility Specialist Start Time (ACUTE ONLY) 1015  Mobility Specialist Stop Time (ACUTE ONLY) 1033  Mobility Specialist Time Calculation (min) (ACUTE ONLY) 18 min   Pt received in bed, agreeable to mobility. Independently able to stand and ambulate with no AD. Tolerated well, asx throughout. Returned to room, all needs met.   Glinda Lapping Mobility Specialist Please contact via Special educational needs teacher or  Rehab office at [phone]

## 2024-02-08 NOTE — Progress Notes (Signed)
 PROGRESS NOTE  Micheal Moses AOZ:[id number] DOB: 1950/10/08 DOA: 02/07/2024 PCP: Chandler Combs, MD   LOS: 0 days   Brief narrative:   Micheal Moses is a 73 y.o. male with medical history significant anxiety arthritis and chronic pain presented to hospital with sudden onset of abdominal pain with vomiting and bloating.  No previous history of abdominal surgeries.  Drinks 1-2 beers daily.  In the ED vitals were stable.  Lipase of 28.  WBC elevated at 11.4.  CT scan of the abdomen pelvis showed mildly dilated small bowel loops in the central abdomen with some abrupt transition.  Scattered mesenteric edema.  Patient was started on Zosyn and was out of the hospital for further evaluation and treatment.    Assessment/Plan: Principal Problem:   SBO (small bowel obstruction) (HCC) Active Problems:   Chronic pain  SBO (small bowel obstruction) (HCC) Presented with abdominal pain and emesis.  No history of abdominal surgery.  CT scan with small bowel obstruction with mesenteric edema.  High-grade obstruction suspected.  General surgery was consulted.  NG tube has been deferred.  Continue supportive care IV fluids.  Follow surgical recommendation.   Chronic pain Patient does have chronic back shoulder and knee pain.  On Percocet at home.  Currently on Dilaudid .   Insomnia-requesting Xanax.   DVT prophylaxis: SCDs Start: 02/07/24 1830   Disposition: Home likely in 1 to 2 days  Status is: Observation The patient will require care spanning > 2 midnights and should be moved to inpatient because: Bowel obstruction, possible need for surgical intervention.    Code Status:     Code Status: Full Code  Family Communication: Spoke with the patient's spouse at bedside  Consultants: General Surgery  Procedures: None yet  Anti-infectives:  Zosyn once  Anti-infectives (From admission, onward)    Start     Dose/Rate Route Frequency Ordered Stop   02/07/24 1545  piperacillin-tazobactam  (ZOSYN) IVPB 3.375 g        3.375 g 100 mL/hr over 30 Minutes Intravenous  Once 02/07/24 1533 02/07/24 1656        Subjective: Today, patient was seen and examined at bedside.  Patient states that he feels a little better now without any nausea vomiting but has some abdominal discomfort.  Has not had any gas or bowel movement.  Objective: Vitals:   02/08/24 0333 02/08/24 0836  BP: 133/80 131/80  Pulse: 64 65  Resp: 18 18  Temp: 98.2 F (36.8 C) 97.8 F (36.6 C)  SpO2: 96% 94%    Intake/Output Summary (Last 24 hours) at 02/08/2024 0907 Last data filed at 02/08/2024 0532 Gross per 24 hour  Intake 827.54 ml  Output --  Net 827.54 ml   Filed Weights   02/07/24 1148  Weight: 70.3 kg   Body mass index is 24.27 kg/m.   Physical Exam: GENERAL: Patient is alert awake and oriented. Not in obvious distress. HENT: No scleral pallor or icterus. Pupils equally reactive to light. Oral mucosa is moist NECK: is supple, no gross swelling noted. CHEST: Clear to auscultation. No crackles or wheezes.  Diminished breath sounds bilaterally. CVS: S1 and S2 heard, no murmur. Regular rate and rhythm.  ABDOMEN: Soft, non-tender, bowel sounds are present.  Mild left lower quadrant tenderness on palpation. EXTREMITIES: No edema. CNS: Cranial nerves are intact. No focal motor deficits. SKIN: warm and dry without rashes.  Data Review: I have personally reviewed the following laboratory data and studies,  CBC: Recent Labs  Lab 02/07/24 1204 02/08/24 0452  WBC 11.4* 9.4  HGB 17.9* 15.0  HCT 51.2 42.7  MCV 91.1 91.6  PLT 220 204   Basic Metabolic Panel: Recent Labs  Lab 02/07/24 1204 02/08/24 0452  NA 137 138  K 4.6 3.8  CL 101 106  CO2 25 26  GLUCOSE 127* 115*  BUN 17 15  CREATININE 1.11 1.01  CALCIUM 9.7 8.7*   Liver Function Tests: Recent Labs  Lab 02/07/24 1204  AST 21  ALT 20  ALKPHOS 63  BILITOT 0.9  PROT 8.3*  ALBUMIN 4.5   Recent Labs  Lab 02/07/24 1204   LIPASE 28   No results for input(s): "AMMONIA" in the last 168 hours. Cardiac Enzymes: No results for input(s): "CKTOTAL", "CKMB", "CKMBINDEX", "TROPONINI" in the last 168 hours. BNP (last 3 results) No results for input(s): "BNP" in the last 8760 hours.  ProBNP (last 3 results) No results for input(s): "PROBNP" in the last 8760 hours.  CBG: No results for input(s): "GLUCAP" in the last 168 hours. No results found for this or any previous visit (from the past 240 hours).   Studies: CT ABDOMEN PELVIS W CONTRAST Result Date: 02/07/2024 CLINICAL DATA:  Abdominal pain.  Nausea vomiting EXAM: CT ABDOMEN AND PELVIS WITH CONTRAST TECHNIQUE: Multidetector CT imaging of the abdomen and pelvis was performed using the standard protocol following bolus administration of intravenous contrast. RADIATION DOSE REDUCTION: This exam was performed according to the departmental dose-optimization program which includes automated exposure control, adjustment of the mA and/or kV according to patient size and/or use of iterative reconstruction technique. CONTRAST:  100mL OMNIPAQUE IOHEXOL 300 MG/ML  SOLN COMPARISON:  None Available. FINDINGS: Lower chest: There is some linear opacity along bases likely scar or atelectasis. No consolidation. There is some calcified lung nodules identified the right lung base greater than left, likely related to old granulomatous disease. There is a 6 mm nodule which is noncalcified just above the right hemidiaphragm on image 14 of series 3. Small hiatal hernia. Hepatobiliary: No space-occupying liver lesion. Patent portal vein. Gallbladder is present. Pancreas: Global atrophy of the pancreas. Spleen: Punctate calcification in the spleen consistent with old granulomatous disease. Adrenals/Urinary Tract: The adrenal glands are preserved. No enhancing renal mass or collecting system dilatation. The ureters have a normal course and caliber down to the bladder preserved contour to the urinary  bladder. Stomach/Bowel: Large bowel has a normal course and caliber. Sigmoid colon diverticula. Normal appendix. Stomach is nondilated. Duodenum is normal course and caliber. Long segment of fluid-filled dilated small bowel identified in the central abdomen. Few air-fluid levels and some areas of small bowel stool appearance. Diameter these loops approach up to 3.5 cm. No pneumatosis. There is a focal transition point seen in the anterior right mid abdomen best seen on coronal series 4, image 43. This corresponds to axial image 48. No mass in this location. Loop of bowel in this location does have some significant wall thickening and edema. There is scattered mesenteric stranding as well as some scattered mild free fluid particularly in the pelvis and adjacent to the liver. No free air identified. Vascular/Lymphatic: Aortic atherosclerosis. No enlarged abdominal or pelvic lymph nodes. Reproductive: Prostate is unremarkable. Other: Small fat containing inguinal hernias, right greater left. Small umbilical hernia. Musculoskeletal: Moderate degenerative changes along the spine. There is trace anterolisthesis at L3-4 and L5-S1. Retrolisthesis of L4-5. 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 No changes along the pelvis. Critical Value/emergent results were called by telephone  at the time of interpretation on 02/07/2024 at 3:34 pm to provider Shyrl Doyne , who verbally acknowledged these results. IMPRESSION: Mildly dilated loop of small bowel in the central abdomen with some small bowel stool appearance. There is an abrupt transition along the anterior mid abdomen. There is also a loop of small bowel extending from this area of caliber change with wall thickening. Scattered mesenteric edema and some trace free fluid. Please correlate for developing obstruction. Based on the overall appearance this could be an early high-grade obstruction versus a partial. Please correlate clinical presentation. Sigmoid  colon diverticula.  Normal appendix small hiatal hernia. Multiple calcified lung nodules. There is 1 right lung base juxtapleural noncalcified 6 mm lung nodule. Non-contrast chest CT at 6-12 months is recommended. If the nodule is stable at time of repeat CT, then future CT at 18-24 months (from today's scan) is considered optional for low-risk patients, but is recommended for high-risk patients. This recommendation follows the consensus statement: Guidelines for Management of Incidental Pulmonary Nodules Detected on CT Images: From the Fleischner Society 2017; Radiology 2017; 284:228-243. Electronically Signed   By: Adrianna Horde M.D.   On: 02/07/2024 16:16      Rosena Conradi, MD  [facility] 02/08/2024  If 7PM-7AM, please contact night-coverage

## 2024-02-08 NOTE — Progress Notes (Addendum)
 Nurse at bedside,patient alert and oriented timed four. Patient ambulating to bathroom Independently.Patient stated"I have no pain right now,only if you press on left side lower stomach". Family at bedside.Plan of care on going.

## 2024-02-08 NOTE — Progress Notes (Signed)
 Nurse at bedside,patient tolerated full liquid diet with no issues.No c/o pain or discomfort noted. Plan of care on going.

## 2024-02-08 NOTE — Consult Note (Signed)
 Reason for Consult:SBO Referring Physician: Rosena Conradi, MD.  Micheal Moses is an 73 y.o. male.   HPI:   The patient is a 73 year old male admitted for a small bowel obstruction, diagnosed via CT scan. He reports being awakened by severe abdominal pain at 4:00 AM. Initially, the pain was localized to the left upper quadrant but soon became diffuse. The patient describes the pain as sharp, consistently rated at a severity of 7, with episodes escalating to 10 before returning to 7. The pain does not radiate.  Prior to the onset of symptoms, the patient denies any pain and reports normal bowel function, with his last bowel movement on 02/06/2024. Since the onset of pain, he has not had a bowel movement or passed flatus. He experienced two episodes of non-bilious vomiting after drinking coffee, one at 6:00 AM and another at 9:00 AM. Persistent pain and vomiting led him to visit the emergency department at 10:30 AM. The patient also reports nausea and bloating but denies any trauma, fever, abdominal distension, hematemesis, melena, hematochezia, or dysuria.  The patient denies tobacco and recreational drug use, consumes about one beer daily, and denies the use of blood thinners or any history of abdominal surgeries.  After admission, the patient was kept NPO and started on IV Dilaudid  as needed for pain, which resulted in marked improvement. He has expressed a desire to return home.  Past Medical History:  Diagnosis Date   Anxiety    Arthritis    rt hand, knees    Past Surgical History:  Procedure Laterality Date   CARPOMETACARPAL (CMC) FUSION OF THUMB  12/12/2011   rt thumb   CARPOMETACARPEL SUSPENSION PLASTY  08/22/2012   Procedure: CARPOMETACARPEL (CMC) SUSPENSION PLASTY;  Surgeon: Hedy Living, MD;  Location: [facility];  Service: Orthopedics;  Laterality: Right;  REDO RIGHT THUMB [facility] ARTHROPLASTY with APL Transfer/Osteophyte Removal/Right Wrist Superfacial Radial  Nerve Neuroplasty    COLONOSCOPY WITH PROPOFOL  N/A 01/09/2018   Surgeon: Alyce Jubilee, MD;  polyps removed, diverticulosis in the rectosigmoid and sigmoid colon, external and internal hemorrhoids.  Pathology revealed sessile serrated polyp, hyperplastic polyp, tubular adenoma.  Recommended repeat colonoscopy in 3 years.   COLONOSCOPY WITH PROPOFOL  N/A 02/04/2022   Procedure: COLONOSCOPY WITH PROPOFOL ;  Surgeon: Vinetta Greening, DO;  [facility];  Service: Endoscopy;  Laterality: N/A;  9:00am   POLYPECTOMY  01/09/2018   Procedure: POLYPECTOMY;  Surgeon: Alyce Jubilee, MD;  [facility];  Service: Endoscopy;;  Transverse colon x1, splenic flexure x1, sigmoid colon x2    Family History  Problem Relation Age of Onset   Colon cancer Neg Hx     Social History:  reports that he has never smoked. He has never used smokeless tobacco. He reports current alcohol use. He reports current drug use. Drug: Marijuana.  Allergies:  Allergies  Allergen Reactions   Mushroom Nausea And Vomiting    Medications: I have reviewed the patient's current medications. Prior to Admission:  Medications Prior to Admission  Medication Sig Dispense Refill Last Dose/Taking   ALPRAZolam (XANAX) 1 MG tablet Take 1 mg by mouth at bedtime.   02/06/2024 Bedtime   oxyCODONE -acetaminophen  (ROXICET) 5-325 MG per tablet Take 1 tablet by mouth every 4 (four) hours as needed for pain. (Patient taking differently: Take 1 tablet by mouth every 6 (six) hours as needed for severe pain (pain score 7-10).) 30 tablet 0 02/04/2024   Scheduled: Continuous:  dextrose 5% lactated ringers   75 mL/hr at 02/07/24 1900   PRN:acetaminophen  **OR** acetaminophen , HYDROmorphone  (DILAUDID ) injection, LORazepam, ondansetron  **OR** ondansetron  (ZOFRAN ) IV  Results for orders placed or performed during the hospital encounter of 02/07/24 (from the past 48 hours)  Lipase, blood     Status: None   Collection Time: 02/07/24  12:04 PM  Result Value Ref Range   Lipase 28 11 - 51 U/L    Comment: Performed at [facility], [address] [place], [place] [address]  Comprehensive metabolic panel     Status: Abnormal   Collection Time: 02/07/24 12:04 PM  Result Value Ref Range   Sodium 137 135 - 145 mmol/L   Potassium 4.6 3.5 - 5.1 mmol/L   Chloride 101 98 - 111 mmol/L   CO2 25 22 - 32 mmol/L   Glucose, Bld 127 (H) 70 - 99 mg/dL    Comment: Glucose reference range applies only to samples taken after fasting for at least 8 hours.   BUN 17 8 - 23 mg/dL   Creatinine, Ser 9.81 0.61 - 1.24 mg/dL   Calcium 9.7 8.9 - 19.1 mg/dL   Total Protein 8.3 (H) 6.5 - 8.1 g/dL   Albumin 4.5 3.5 - 5.0 g/dL   AST 21 15 - 41 U/L   ALT 20 0 - 44 U/L   Alkaline Phosphatase 63 38 - 126 U/L   Total Bilirubin 0.9 0.0 - 1.2 mg/dL   GFR, Estimated >47 >82 mL/min    Comment: (NOTE) Calculated using the CKD-EPI Creatinine Equation (2021)    Anion gap 11 5 - 15    Comment: Performed at [facility], [address] [place], [place] [address]  CBC     Status: Abnormal   Collection Time: 02/07/24 12:04 PM  Result Value Ref Range   WBC 11.4 (H) 4.0 - 10.5 K/uL   RBC 5.62 4.22 - 5.81 MIL/uL   Hemoglobin 17.9 (H) 13.0 - 17.0 g/dL   HCT 30.8 65.7 - 84.6 %   MCV 91.1 80.0 - 100.0 fL   MCH 31.9 26.0 - 34.0 pg   MCHC 35.0 30.0 - 36.0 g/dL   RDW 96.2 95.2 - 84.1 %   Platelets 220 150 - 400 K/uL   nRBC 0.0 0.0 - 0.2 %    Comment: Performed at [facility], [address], [place], [place] [address]  Urinalysis, Routine w reflex microscopic -Urine, Clean Catch     Status: Abnormal   Collection Time: 02/07/24  2:46 PM  Result Value Ref Range   Color, Urine YELLOW YELLOW   APPearance HAZY (A) CLEAR   Specific Gravity, Urine 1.023 1.005 - 1.030   pH 5.0 5.0 - 8.0   Glucose, UA NEGATIVE NEGATIVE mg/dL   Hgb urine dipstick NEGATIVE NEGATIVE   Bilirubin Urine NEGATIVE NEGATIVE   Ketones, ur NEGATIVE NEGATIVE mg/dL   Protein, ur NEGATIVE  NEGATIVE mg/dL   Nitrite NEGATIVE NEGATIVE   Leukocytes,Ua NEGATIVE NEGATIVE    Comment: Performed at [facility], [address] [place], [place] [address]  Basic metabolic panel     Status: Abnormal   Collection Time: 02/08/24  4:52 AM  Result Value Ref Range   Sodium 138 135 - 145 mmol/L   Potassium 3.8 3.5 - 5.1 mmol/L   Chloride 106 98 - 111 mmol/L   CO2 26 22 - 32 mmol/L   Glucose, Bld 115 (H) 70 - 99 mg/dL    Comment: Glucose reference range applies only to samples taken after fasting for at least  8 hours.   BUN 15 8 - 23 mg/dL   Creatinine, Ser 1.61 0.61 - 1.24 mg/dL   Calcium 8.7 (L) 8.9 - 10.3 mg/dL   GFR, Estimated >09 >60 mL/min    Comment: (NOTE) Calculated using the CKD-EPI Creatinine Equation (2021)    Anion gap 6 5 - 15    Comment: Performed at [facility], [address] [place], [place] [address]  CBC     Status: None   Collection Time: 02/08/24  4:52 AM  Result Value Ref Range   WBC 9.4 4.0 - 10.5 K/uL   RBC 4.66 4.22 - 5.81 MIL/uL   Hemoglobin 15.0 13.0 - 17.0 g/dL   HCT 81.1 91.4 - 78.2 %   MCV 91.6 80.0 - 100.0 fL   MCH 32.2 26.0 - 34.0 pg   MCHC 35.1 30.0 - 36.0 g/dL   RDW 95.6 21.3 - 08.6 %   Platelets 204 150 - 400 K/uL   nRBC 0.0 0.0 - 0.2 %    Comment: Performed at [facility], [address], [place], [place] [address]    CT ABDOMEN PELVIS W CONTRAST Result Date: 02/07/2024 CLINICAL DATA:  Abdominal pain.  Nausea vomiting EXAM: CT ABDOMEN AND PELVIS WITH CONTRAST TECHNIQUE: Multidetector CT imaging of the abdomen and pelvis was performed using the standard protocol following bolus administration of intravenous contrast. RADIATION DOSE REDUCTION: This exam was performed according to the departmental dose-optimization program which includes automated exposure control, adjustment of the mA and/or kV according to patient size and/or use of iterative reconstruction technique. CONTRAST:  100mL OMNIPAQUE IOHEXOL 300 MG/ML  SOLN COMPARISON:  None  Available. FINDINGS: Lower chest: There is some linear opacity along bases likely scar or atelectasis. No consolidation. There is some calcified lung nodules identified the right lung base greater than left, likely related to old granulomatous disease. There is a 6 mm nodule which is noncalcified just above the right hemidiaphragm on image 14 of series 3. Small hiatal hernia. Hepatobiliary: No space-occupying liver lesion. Patent portal vein. Gallbladder is present. Pancreas: Global atrophy of the pancreas. Spleen: Punctate calcification in the spleen consistent with old granulomatous disease. Adrenals/Urinary Tract: The adrenal glands are preserved. No enhancing renal mass or collecting system dilatation. The ureters have a normal course and caliber down to the bladder preserved contour to the urinary bladder. Stomach/Bowel: Large bowel has a normal course and caliber. Sigmoid colon diverticula. Normal appendix. Stomach is nondilated. Duodenum is normal course and caliber. Long segment of fluid-filled dilated small bowel identified in the central abdomen. Few air-fluid levels and some areas of small bowel stool appearance. Diameter these loops approach up to 3.5 cm. No pneumatosis. There is a focal transition point seen in the anterior right mid abdomen best seen on coronal series 4, image 43. This corresponds to axial image 48. No mass in this location. Loop of bowel in this location does have some significant wall thickening and edema. There is scattered mesenteric stranding as well as some scattered mild free fluid particularly in the pelvis and adjacent to the liver. No free air identified. Vascular/Lymphatic: Aortic atherosclerosis. No enlarged abdominal or pelvic lymph nodes. Reproductive: Prostate is unremarkable. Other: Small fat containing inguinal hernias, right greater left. Small umbilical hernia. Musculoskeletal: Moderate degenerative changes along the spine. There is trace anterolisthesis at L3-4 and  L5-S1. Retrolisthesis of L4-5. 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 No changes along the pelvis. Critical Value/emergent results were called by telephone at the time of interpretation  on 02/07/2024 at 3:34 pm to provider Shyrl Doyne , who verbally acknowledged these results. IMPRESSION: Mildly dilated loop of small bowel in the central abdomen with some small bowel stool appearance. There is an abrupt transition along the anterior mid abdomen. There is also a loop of small bowel extending from this area of caliber change with wall thickening. Scattered mesenteric edema and some trace free fluid. Please correlate for developing obstruction. Based on the overall appearance this could be an early high-grade obstruction versus a partial. Please correlate clinical presentation. Sigmoid colon diverticula.  Normal appendix small hiatal hernia. Multiple calcified lung nodules. There is 1 right lung base juxtapleural noncalcified 6 mm lung nodule. Non-contrast chest CT at 6-12 months is recommended. If the nodule is stable at time of repeat CT, then future CT at 18-24 months (from today's scan) is considered optional for low-risk patients, but is recommended for high-risk patients. This recommendation follows the consensus statement: Guidelines for Management of Incidental Pulmonary Nodules Detected on CT Images: From the Fleischner Society 2017; Radiology 2017; 284:228-243. Electronically Signed   By: Adrianna Horde M.D.   On: 02/07/2024 16:16   ROS:  Pertinent items are noted in HPI.  Blood pressure 133/80, pulse 64, temperature 98.2 F (36.8 C), temperature source Oral, resp. rate 18, height 5\' 7"  (1.702 m), weight 70.3 kg, SpO2 96%.  Physical Exam Constitutional:      General: He is not in acute distress.    Appearance: He is normal weight. He is not ill-appearing, toxic-appearing or diaphoretic.  Cardiovascular:     Rate and Rhythm: Normal rate and regular rhythm.     Heart sounds:  Normal heart sounds. No murmur heard.    No gallop.  Pulmonary:     Effort: Pulmonary effort is normal. No respiratory distress.     Breath sounds: Normal breath sounds.  Abdominal:     General: Abdomen is flat. Bowel sounds are absent. There is no distension.     Palpations: Abdomen is soft. There is no mass.     Tenderness: There is abdominal tenderness in the periumbilical area. There is guarding. There is no right CVA tenderness or left CVA tenderness.       Comments: Tenderness in the area of red dot associated with gauarding  Skin:    General: Skin is warm and dry.     Coloration: Skin is not cyanotic, jaundiced or pale.  Neurological:     Mental Status: He is alert.  Psychiatric:        Mood and Affect: Mood normal.        Behavior: Behavior normal.    Assessment/Plan:  The patient is a 73 year old man with no significant medical, surgical, or social history, admitted for abdominal pain due to a small bowel obstruction, confirmed by CT scan. Although he does not exhibit symptoms necessitating immediate surgical intervention, such as signs of sepsis or peritonitis, the absence of bowel movements and flatus, along with physical examination findings and CT results indicating a high-grade obstruction, suggest that operative management is warranted.  Tal Kempker 02/08/2024, 7:27 AM

## 2024-02-08 NOTE — TOC CM/SW Note (Signed)
 Transition of Care [facility]) - Inpatient Brief Assessment   Patient Details  Name: Micheal Moses MRN: [id number] Date of Birth: May 27, 1951  Transition of Care [facility]) CM/SW Contact:    Grandville Lax, LCSWA Phone Number: 02/08/2024, 9:43 AM   Clinical Narrative: Transition of Care Department [facility]) has reviewed patient and no TOC needs have been identified at this time. We will continue to monitor patient advancement through interdiciplinary progression rounds. If new patient transition needs arise, please place a TOC consult.  Transition of Care Asessment: Insurance and Status: Insurance coverage has been reviewed Patient has primary care physician: Yes Home environment has been reviewed: From home Prior level of function:: Independent Prior/Current Home Services: No current home services Social Drivers of Health Review: SDOH reviewed no interventions necessary Readmission risk has been reviewed: Yes Transition of care needs: no transition of care needs at this time

## 2024-02-09 DIAGNOSIS — K56609 Unspecified intestinal obstruction, unspecified as to partial versus complete obstruction: Secondary | ICD-10-CM | POA: Diagnosis not present

## 2024-02-09 LAB — MAGNESIUM: Magnesium: 2.1 mg/dL (ref 1.7–2.4)

## 2024-02-09 LAB — CBC
HCT: 43 % (ref 39.0–52.0)
Hemoglobin: 14.8 g/dL (ref 13.0–17.0)
MCH: 31.8 pg (ref 26.0–34.0)
MCHC: 34.4 g/dL (ref 30.0–36.0)
MCV: 92.3 fL (ref 80.0–100.0)
Platelets: 180 10*3/uL (ref 150–400)
RBC: 4.66 MIL/uL (ref 4.22–5.81)
RDW: 12.2 % (ref 11.5–15.5)
WBC: 6 10*3/uL (ref 4.0–10.5)
nRBC: 0 % (ref 0.0–0.2)

## 2024-02-09 LAB — BASIC METABOLIC PANEL WITH GFR
Anion gap: 7 (ref 5–15)
BUN: 17 mg/dL (ref 8–23)
CO2: 26 mmol/L (ref 22–32)
Calcium: 8.6 mg/dL — ABNORMAL LOW (ref 8.9–10.3)
Chloride: 105 mmol/L (ref 98–111)
Creatinine, Ser: 1.05 mg/dL (ref 0.61–1.24)
GFR, Estimated: >60 mL/min (ref >60)
Glucose, Bld: 92 mg/dL (ref 70–99)
Potassium: 4 mmol/L (ref 3.5–5.1)
Sodium: 138 mmol/L (ref 135–145)

## 2024-02-09 NOTE — TOC Transition Note (Signed)
 Transition of Care [facility]) - Discharge Note   Patient Details  Name: Micheal Moses MRN: [id number] Date of Birth: 04-Jun-1951  Transition of Care [facility] CM/SW Contact:  Orelia Binet, RN Phone Number: 02/09/2024, 10:47 AM   Clinical Narrative:   General surgeon discharged patient home.  MD asking TOC about a code 65, UR stated they will probably get an IP auth after he leaves. He has 2 midnights. Nurse has discharged patient when message came to [facility].     Final next level of care: Home/Self Care Barriers to Discharge: Continued Medical Work up   Patient Goals and CMS Choice            Discharge Placement                       Discharge Plan and Services Additional resources added to the After Visit Summary for                                       Social Drivers of Health (SDOH) Interventions SDOH Screenings   Food Insecurity: No Food Insecurity (02/07/2024)  Housing: Low Risk  (02/07/2024)  Transportation Needs: No Transportation Needs (02/07/2024)  Utilities: Not At Risk (02/07/2024)  Financial Resource Strain: Low Risk  (11/13/2023)   Received from [facility]  Physical Activity: Insufficiently Active (05/08/2023)   Received from [facility]  Social Connections: Socially Integrated (02/07/2024)  Stress: No Stress Concern Present (05/08/2023)   Received from [facility]  Tobacco Use: Low Risk  (02/07/2024)     Readmission Risk Interventions     No data to display

## 2024-02-09 NOTE — Progress Notes (Signed)
 Patient discharged home. All questions and concerns answered. Patient has all personal belongings, including discharge paperwork. Patient left floor via ambulation to POV.

## 2024-02-09 NOTE — Plan of Care (Signed)
  Problem: Education: Goal: Knowledge of General Education information will improve Description: Including pain rating scale, medication(s)/side effects and non-pharmacologic comfort measures Outcome: Adequate for Discharge   Problem: Health Behavior/Discharge Planning: Goal: Ability to manage health-related needs will improve Outcome: Adequate for Discharge   Problem: Clinical Measurements: Goal: Ability to maintain clinical measurements within normal limits will improve Outcome: Adequate for Discharge Goal: Diagnostic test results will improve Outcome: Adequate for Discharge

## 2024-02-09 NOTE — Discharge Summary (Signed)
 Physician Discharge Summary  Patient ID: Micheal Moses MRN: [id number] DOB/AGE: 1951-06-09 73 y.o.  Admit date: 02/07/2024 Discharge date: 02/09/2024  Admission Diagnoses: Small bowel obstruction  Discharge Diagnoses:  Principal Problem:   SBO (small bowel obstruction) (HCC) Active Problems:   Chronic pain   Discharged Condition: good  Hospital Course: Patient is a 73 year old white male who presented to the emergency room on 02/07/2024 with worsening upper abdominal pain, nausea, and vomiting.  CT scan of the abdomen was suggestive of a partial early small bowel obstruction.  The patient was admitted to the hospital for further evaluation and treatment.  No NG tube was used.  The patient started passing flatus on 02/08/2024.  It was decided to advance his diet.  MiraLAX and milk of magnesia were given.  Patient had a bowel movement on 02/09/2024.  Tolerated regular diet well.  He is being discharged home on 02/09/2024 in good and improving condition.  Discharge Exam: Blood pressure 116/78, pulse 61, temperature 97.9 F (36.6 C), temperature source Oral, resp. rate 18, height 5\' 7"  (1.702 m), weight 70.3 kg, SpO2 95%. General appearance: alert, cooperative, and no distress Resp: clear to auscultation bilaterally Cardio: regular rate and rhythm, S1, S2 normal, no murmur, click, rub or gallop GI: soft, non-tender; bowel sounds normal; no masses,  no organomegaly  Disposition: Discharge disposition: 01-Home or Self Care       Discharge Instructions     Diet - low sodium heart healthy   Complete by: As directed    Increase activity slowly   Complete by: As directed       Allergies as of 02/09/2024       Reactions   Mushroom Nausea And Vomiting        Medication List     TAKE these medications    ALPRAZolam 1 MG tablet Commonly known as: XANAX Take 1 mg by mouth at bedtime.   oxyCODONE -acetaminophen  5-325 MG tablet Commonly known as: Roxicet Take 1 tablet by mouth  every 4 (four) hours as needed for pain. What changed:  when to take this reasons to take this        Follow-up Information     Alanda Allegra, MD Follow up.   Specialty: General Surgery Why: As needed Contact information: [address] [place] [address] [phone]                 Signed: Alanda Allegra 02/09/2024, 9:22 AM

## 2024-02-09 NOTE — Progress Notes (Signed)
 Subjective:  The patient is doing well overall. He is tolerating solid foods without any increase in pain, nausea, or vomiting. He reports being able to pass flatus but has not yet had a bowel movement, despite feeling the urge. The patient is ambulating well and reports mild abdominal pain with bloating. He denies experiencing fever, chest pain, difficulty breathing, fatigue, or dysuria.  Objective: Vital signs in last 24 hours: Temp:  [97.8 F (36.6 C)-98.2 F (36.8 C)] 97.9 F (36.6 C) (05/30 0458) Pulse Rate:  [58-65] 61 (05/30 0458) Resp:  [16-18] 18 (05/30 0458) BP: (116-133)/(65-80) 116/78 (05/30 0458) SpO2:  [94 %-96 %] 95 % (05/30 0458) Last BM Date : 02/06/24  Intake/Output from previous day: 05/29 0701 - 05/30 0700 In: 497.2 [I.V.:497.2] Out: -  Intake/Output this shift: No intake/output data recorded.  General appearance: alert, cooperative, and no distress Resp: clear to auscultation bilaterally Cardio: regular rate and rhythm, S1, S2 normal, no murmur, click, rub or gallop GI: normal findings: bowel sounds normal and no masses palpable and abnormal findings:  mild tenderness in the entire abdomen Skin: Skin color, texture, turgor normal. No rashes or lesions  Lab Results:  Recent Labs    02/08/24 0452 02/09/24 0456  WBC 9.4 6.0  HGB 15.0 14.8  HCT 42.7 43.0  PLT 204 180   BMET Recent Labs    02/08/24 0452 02/09/24 0456  NA 138 138  K 3.8 4.0  CL 106 105  CO2 26 26  GLUCOSE 115* 92  BUN 15 17  CREATININE 1.01 1.05  CALCIUM 8.7* 8.6*   PT/INR No results for input(s): "LABPROT", "INR" in the last 72 hours.  Studies/Results: CT ABDOMEN PELVIS W CONTRAST Result Date: 02/07/2024 CLINICAL DATA:  Abdominal pain.  Nausea vomiting EXAM: CT ABDOMEN AND PELVIS WITH CONTRAST TECHNIQUE: Multidetector CT imaging of the abdomen and pelvis was performed using the standard protocol following bolus administration of intravenous contrast. RADIATION DOSE  REDUCTION: This exam was performed according to the departmental dose-optimization program which includes automated exposure control, adjustment of the mA and/or kV according to patient size and/or use of iterative reconstruction technique. CONTRAST:  100mL OMNIPAQUE  IOHEXOL  300 MG/ML  SOLN COMPARISON:  None Available. FINDINGS: Lower chest: There is some linear opacity along bases likely scar or atelectasis. No consolidation. There is some calcified lung nodules identified the right lung base greater than left, likely related to old granulomatous disease. There is a 6 mm nodule which is noncalcified just above the right hemidiaphragm on image 14 of series 3. Small hiatal hernia. Hepatobiliary: No space-occupying liver lesion. Patent portal vein. Gallbladder is present. Pancreas: Global atrophy of the pancreas. Spleen: Punctate calcification in the spleen consistent with old granulomatous disease. Adrenals/Urinary Tract: The adrenal glands are preserved. No enhancing renal mass or collecting system dilatation. The ureters have a normal course and caliber down to the bladder preserved contour to the urinary bladder. Stomach/Bowel: Large bowel has a normal course and caliber. Sigmoid colon diverticula. Normal appendix. Stomach is nondilated. Duodenum is normal course and caliber. Long segment of fluid-filled dilated small bowel identified in the central abdomen. Few air-fluid levels and some areas of small bowel stool appearance. Diameter these loops approach up to 3.5 cm. No pneumatosis. There is a focal transition point seen in the anterior right mid abdomen best seen on coronal series 4, image 43. This corresponds to axial image 48. No mass in this location. Loop of bowel in this location does have some significant wall thickening and  edema. There is scattered mesenteric stranding as well as some scattered mild free fluid particularly in the pelvis and adjacent to the liver. No free air identified.  Vascular/Lymphatic: Aortic atherosclerosis. No enlarged abdominal or pelvic lymph nodes. Reproductive: Prostate is unremarkable. Other: Small fat containing inguinal hernias, right greater left. Small umbilical hernia. Musculoskeletal: Moderate degenerative changes along the spine. There is trace anterolisthesis at L3-4 and L5-S1. Retrolisthesis of L4-5. 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 No changes along the pelvis. Critical Value/emergent results were called by telephone at the time of interpretation on 02/07/2024 at 3:34 pm to provider Shyrl Doyne , who verbally acknowledged these results. IMPRESSION: Mildly dilated loop of small bowel in the central abdomen with some small bowel stool appearance. There is an abrupt transition along the anterior mid abdomen. There is also a loop of small bowel extending from this area of caliber change with wall thickening. Scattered mesenteric edema and some trace free fluid. Please correlate for developing obstruction. Based on the overall appearance this could be an early high-grade obstruction versus a partial. Please correlate clinical presentation. Sigmoid colon diverticula.  Normal appendix small hiatal hernia. Multiple calcified lung nodules. There is 1 right lung base juxtapleural noncalcified 6 mm lung nodule. Non-contrast chest CT at 6-12 months is recommended. If the nodule is stable at time of repeat CT, then future CT at 18-24 months (from today's scan) is considered optional for low-risk patients, but is recommended for high-risk patients. This recommendation follows the consensus statement: Guidelines for Management of Incidental Pulmonary Nodules Detected on CT Images: From the Fleischner Society 2017; Radiology 2017; 284:228-243. Electronically Signed   By: Adrianna Horde M.D.   On: 02/07/2024 16:16    Anti-infectives: Anti-infectives (From admission, onward)    Start     Dose/Rate Route Frequency Ordered Stop   02/07/24 1545   piperacillin-tazobactam (ZOSYN) IVPB 3.375 g        3.375 g 100 mL/hr over 30 Minutes Intravenous  Once 02/07/24 1533 02/07/24 1656       Assessment/Plan: s/p   The patient is doing well overall. The lack of a bowel movement is slightly concerning, and a small bowel obstruction protocol would be considered; however, the patient has expressed a desire to go home. Proceed with discharge and provide instructions for immediate return if the patient experiences increased pain, fever, altered mental status, nausea, or vomiting.   LOS: 1 day    [facility] 02/09/2024

## 2024-02-09 NOTE — Progress Notes (Signed)
 PROGRESS NOTE     Micheal Moses, is Moses 73 y.o. male, DOB - 10/13/1950, GNF:[id number]  Admit date - 02/07/2024   Admitting Physician Ejiroghene Debroah Fanning, MD  Outpatient Primary MD for the patient is [name], [name], MD  LOS - 1  Chief Complaint  Patient presents with   Abdominal Pain        Brief Narrative:  73 y.o. male with medical history significant for chronic pain (on opiates) and history of chronic alcohol use admitted on 02/08/2024 with abdominal pain, nausea vomiting and found to have SBO     -Assessment and Plan: 1)SBO (small bowel obstruction) ---POA -Admitted with abdominal pain and emesis  -No history of abdominal surgeries.   - CT abdomen and pelvis with contrast shows small bowel obstruction scattered mesenteric edema and some trace free fluid, with an abrupt transition along the anterior mid abdomen.  -  Concerning for early high-grade obstruction versus partial SBO - Treated with bowel rest, IV fluids and antiemetics did not require NG tube - General Surgery consult appreciated - Patient started to have BM, passing gas and tolerating oral intake well - As per general surgeon okay to discharge home today - Chronic opiate use may have contributed to bowel issues  2)Chronic pain Chronic back, knee pains, shoulder pains---patient is chronically on opiates at home -- Be judicious with opiates  3)Multiple calcified lung nodules. There is 1 right lung base juxtapleural noncalcified 6 mm lung nodule. -- Patient is Moses non-smoker - Repeat CT chest without contrast in 6 to 12 months as outpatient advised  4)Alcohol Use--patient drinks 10-12 beers per week - Moderation of alcohol intake to 2 drinks per week advised  Disposition: The patient is from: Home              Anticipated d/c is to: Home                Code Status :  -  Code Status: Full Code   Family Communication:   (patient is alert, awake and coherent)  Discussed with wife at bedside  DVT  Prophylaxis  :   - SCDs  SCDs Start: 02/07/24 1830   Lab Results  Component Value Date   PLT 180 02/09/2024    Inpatient Medications  Scheduled Meds:  magnesium hydroxide  30 mL Oral Daily   polyethylene glycol  17 g Oral Daily   Continuous Infusions: PRN Meds:.acetaminophen  **OR** acetaminophen , HYDROmorphone  (DILAUDID ) injection, LORazepam, ondansetron  **OR** ondansetron  (ZOFRAN ) IV   Anti-infectives (From admission, onward)    Start     Dose/Rate Route Frequency Ordered Stop   02/07/24 1545  piperacillin-tazobactam (ZOSYN) IVPB 3.375 g        3.375 g 100 mL/hr over 30 Minutes Intravenous  Once 02/07/24 1533 02/07/24 1656         Subjective: Bufford Carne today has no fevers, no further emesis,  No chest pain,   - Having BM, passing gas, -Eating and drinking well - Wife at bedside, questions answered   Objective: Vitals:   02/08/24 0836 02/08/24 1411 02/08/24 1946 02/09/24 0458  BP: 131/80 127/65 133/77 116/78  Pulse: 65 61 (!) 58 61  Resp: 18 18 16 18   Temp: 97.8 F (36.6 C) 98.2 F (36.8 C) 97.8 F (36.6 C) 97.9 F (36.6 C)  TempSrc: Oral Oral Oral Oral  SpO2: 94% 96% 96% 95%  Weight:      Height:        Intake/Output Summary (Last 24 hours)  at 02/09/2024 3710 Last data filed at 02/08/2024 1518 Gross per 24 hour  Intake 497.19 ml  Output --  Net 497.19 ml   Filed Weights   02/07/24 1148  Weight: 70.3 kg    Physical Exam  Gen:- Awake Alert,  in no apparent distress  HEENT:- [place].AT, No sclera icterus Neck-Supple Neck,No JVD,.  Lungs-  CTAB , fair symmetrical air movement CV- S1, S2 normal, regular  Abd-  +ve B.Sounds, Abd Soft, No tenderness,    Extremity/Skin:- No  edema, pedal pulses present  Psych-affect is appropriate, oriented x3 Neuro-no new focal deficits, no tremors  Data Reviewed: I have personally reviewed following labs and imaging studies  CBC: Recent Labs  Lab 02/07/24 1204 02/08/24 0452 02/09/24 0456  WBC 11.4* 9.4 6.0   HGB 17.9* 15.0 14.8  HCT 51.2 42.7 43.0  MCV 91.1 91.6 92.3  PLT 220 204 180   Basic Metabolic Panel: Recent Labs  Lab 02/07/24 1204 02/08/24 0452 02/09/24 0456  NA 137 138 138  K 4.6 3.8 4.0  CL 101 106 105  CO2 25 26 26   GLUCOSE 127* 115* 92  BUN 17 15 17   CREATININE 1.11 1.01 1.05  CALCIUM 9.7 8.7* 8.6*  MG  --   --  2.1   GFR: Estimated Creatinine Clearance: 61.2 mL/min (by C-G formula based on SCr of 1.05 mg/dL). Liver Function Tests: Recent Labs  Lab 02/07/24 1204  AST 21  ALT 20  ALKPHOS 63  BILITOT 0.9  PROT 8.3*  ALBUMIN 4.5   Radiology Studies: CT ABDOMEN PELVIS W CONTRAST Result Date: 02/07/2024 CLINICAL DATA:  Abdominal pain.  Nausea vomiting EXAM: CT ABDOMEN AND PELVIS WITH CONTRAST TECHNIQUE: Multidetector CT imaging of the abdomen and pelvis was performed using the standard protocol following bolus administration of intravenous contrast. RADIATION DOSE REDUCTION: This exam was performed according to the departmental dose-optimization program which includes automated exposure control, adjustment of the mA and/or kV according to patient size and/or use of iterative reconstruction technique. CONTRAST:  100mL OMNIPAQUE IOHEXOL 300 MG/ML  SOLN COMPARISON:  None Available. FINDINGS: Lower chest: There is some linear opacity along bases likely scar or atelectasis. No consolidation. There is some calcified lung nodules identified the right lung base greater than left, likely related to old granulomatous disease. There is Moses 6 mm nodule which is noncalcified just above the right hemidiaphragm on image 14 of series 3. Small hiatal hernia. Hepatobiliary: No space-occupying liver lesion. Patent portal vein. Gallbladder is present. Pancreas: Global atrophy of the pancreas. Spleen: Punctate calcification in the spleen consistent with old granulomatous disease. Adrenals/Urinary Tract: The adrenal glands are preserved. No enhancing renal mass or collecting system dilatation. The  ureters have Moses normal course and caliber down to the bladder preserved contour to the urinary bladder. Stomach/Bowel: Large bowel has Moses normal course and caliber. Sigmoid colon diverticula. Normal appendix. Stomach is nondilated. Duodenum is normal course and caliber. Long segment of fluid-filled dilated small bowel identified in the central abdomen. Few air-fluid levels and some areas of small bowel stool appearance. Diameter these loops approach up to 3.5 cm. No pneumatosis. There is Moses focal transition point seen in the anterior right mid abdomen best seen on coronal series 4, image 43. This corresponds to axial image 48. No mass in this location. Loop of bowel in this location does have some significant wall thickening and edema. There is scattered mesenteric stranding as well as some scattered mild free fluid particularly in the pelvis and adjacent to  the liver. No free air identified. Vascular/Lymphatic: Aortic atherosclerosis. No enlarged abdominal or pelvic lymph nodes. Reproductive: Prostate is unremarkable. Other: Small fat containing inguinal hernias, right greater left. Small umbilical hernia. Musculoskeletal: Moderate degenerative changes along the spine. There is trace anterolisthesis at L3-4 and L5-S1. Retrolisthesis of L4-5. 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 No changes along the pelvis. Critical Value/emergent results were called by telephone at the time of interpretation on 02/07/2024 at 3:34 pm to provider Shyrl Doyne , who verbally acknowledged these results. IMPRESSION: Mildly dilated loop of small bowel in the central abdomen with some small bowel stool appearance. There is an abrupt transition along the anterior mid abdomen. There is also Moses loop of small bowel extending from this area of caliber change with wall thickening. Scattered mesenteric edema and some trace free fluid. Please correlate for developing obstruction. Based on the overall appearance this could be an  early high-grade obstruction versus Moses partial. Please correlate clinical presentation. Sigmoid colon diverticula.  Normal appendix small hiatal hernia. Multiple calcified lung nodules. There is 1 right lung base juxtapleural noncalcified 6 mm lung nodule. Non-contrast chest CT at 6-12 months is recommended. If the nodule is stable at time of repeat CT, then future CT at 18-24 months (from today's scan) is considered optional for low-risk patients, but is recommended for high-risk patients. This recommendation follows the consensus statement: Guidelines for Management of Incidental Pulmonary Nodules Detected on CT Images: From the Fleischner Society 2017; Radiology 2017; 284:228-243. Electronically Signed   By: Adrianna Horde M.D.   On: 02/07/2024 16:16    Scheduled Meds:  magnesium hydroxide  30 mL Oral Daily   polyethylene glycol  17 g Oral Daily   Continuous Infusions:   LOS: 1 day    Colin Dawley M.D on 02/09/2024 at 9:28 AM  Go to [email or web address] - for contact info  [facility] - Office  [phone]  If 7PM-7AM, please contact night-coverage [email or web address] 02/09/2024, 9:28 AM
